# Patient Record
Sex: Female | Born: 1940 | Race: White | Hispanic: No | Smoking: Never smoker
Health system: Southern US, Community
[De-identification: ages and names within clinical notes are randomized; demographics above are authoritative.]

## PROBLEM LIST (undated history)

## (undated) DIAGNOSIS — F0390 Unspecified dementia without behavioral disturbance: Secondary | ICD-10-CM

## (undated) DIAGNOSIS — E079 Disorder of thyroid, unspecified: Secondary | ICD-10-CM

## (undated) HISTORY — PX: ABDOMINAL HYSTERECTOMY: SHX81

---

## 2018-11-25 ENCOUNTER — Encounter (HOSPITAL_BASED_OUTPATIENT_CLINIC_OR_DEPARTMENT_OTHER): Payer: Self-pay | Admitting: Emergency Medicine

## 2018-11-25 ENCOUNTER — Emergency Department (HOSPITAL_BASED_OUTPATIENT_CLINIC_OR_DEPARTMENT_OTHER): Payer: Medicare HMO

## 2018-11-25 ENCOUNTER — Other Ambulatory Visit: Payer: Self-pay

## 2018-11-25 ENCOUNTER — Observation Stay (HOSPITAL_BASED_OUTPATIENT_CLINIC_OR_DEPARTMENT_OTHER)
Admission: EM | Admit: 2018-11-25 | Discharge: 2018-11-27 | Disposition: A | Discharge status: Home / Self Care | Payer: Medicare HMO | Attending: Internal Medicine | Admitting: Internal Medicine

## 2018-11-25 DIAGNOSIS — R2681 Unsteadiness on feet: Secondary | ICD-10-CM | POA: Insufficient documentation

## 2018-11-25 DIAGNOSIS — E039 Hypothyroidism, unspecified: Secondary | ICD-10-CM | POA: Diagnosis not present

## 2018-11-25 DIAGNOSIS — I2699 Other pulmonary embolism without acute cor pulmonale: Secondary | ICD-10-CM | POA: Diagnosis not present

## 2018-11-25 DIAGNOSIS — Z888 Allergy status to other drugs, medicaments and biological substances status: Secondary | ICD-10-CM | POA: Insufficient documentation

## 2018-11-25 DIAGNOSIS — I82412 Acute embolism and thrombosis of left femoral vein: Secondary | ICD-10-CM | POA: Diagnosis not present

## 2018-11-25 DIAGNOSIS — R531 Weakness: Secondary | ICD-10-CM | POA: Insufficient documentation

## 2018-11-25 DIAGNOSIS — R630 Anorexia: Secondary | ICD-10-CM | POA: Insufficient documentation

## 2018-11-25 DIAGNOSIS — Z6822 Body mass index (BMI) 22.0-22.9, adult: Secondary | ICD-10-CM | POA: Diagnosis not present

## 2018-11-25 DIAGNOSIS — E876 Hypokalemia: Secondary | ICD-10-CM | POA: Insufficient documentation

## 2018-11-25 DIAGNOSIS — Z79899 Other long term (current) drug therapy: Secondary | ICD-10-CM | POA: Insufficient documentation

## 2018-11-25 DIAGNOSIS — G309 Alzheimer's disease, unspecified: Secondary | ICD-10-CM | POA: Diagnosis not present

## 2018-11-25 DIAGNOSIS — R159 Full incontinence of feces: Secondary | ICD-10-CM | POA: Insufficient documentation

## 2018-11-25 DIAGNOSIS — F0390 Unspecified dementia without behavioral disturbance: Secondary | ICD-10-CM | POA: Diagnosis present

## 2018-11-25 DIAGNOSIS — Z79890 Hormone replacement therapy (postmenopausal): Secondary | ICD-10-CM | POA: Insufficient documentation

## 2018-11-25 DIAGNOSIS — Z90710 Acquired absence of both cervix and uterus: Secondary | ICD-10-CM | POA: Diagnosis not present

## 2018-11-25 DIAGNOSIS — K5730 Diverticulosis of large intestine without perforation or abscess without bleeding: Secondary | ICD-10-CM | POA: Insufficient documentation

## 2018-11-25 HISTORY — DX: Disorder of thyroid, unspecified: E07.9

## 2018-11-25 HISTORY — DX: Unspecified dementia, unspecified severity, without behavioral disturbance, psychotic disturbance, mood disturbance, and anxiety: F03.90

## 2018-11-25 LAB — URINALYSIS, MICROSCOPIC (REFLEX)

## 2018-11-25 LAB — CBC WITH DIFFERENTIAL/PLATELET
Abs Immature Granulocytes: 0.05 10*3/uL (ref 0.00–0.07)
Basophils Absolute: 0.1 10*3/uL (ref 0.0–0.1)
Basophils Relative: 1 %
EOS PCT: 1 %
Eosinophils Absolute: 0.1 10*3/uL (ref 0.0–0.5)
HCT: 43.5 % (ref 36.0–46.0)
Hemoglobin: 13.4 g/dL (ref 12.0–15.0)
Immature Granulocytes: 1 %
LYMPHS PCT: 16 %
Lymphs Abs: 1.6 10*3/uL (ref 0.7–4.0)
MCH: 28.7 pg (ref 26.0–34.0)
MCHC: 30.8 g/dL (ref 30.0–36.0)
MCV: 93.1 fL (ref 80.0–100.0)
Monocytes Absolute: 0.8 10*3/uL (ref 0.1–1.0)
Monocytes Relative: 8 %
Neutro Abs: 7.6 10*3/uL (ref 1.7–7.7)
Neutrophils Relative %: 73 %
Platelets: 412 10*3/uL — ABNORMAL HIGH (ref 150–400)
RBC: 4.67 MIL/uL (ref 3.87–5.11)
RDW: 13.3 % (ref 11.5–15.5)
WBC: 10.2 10*3/uL (ref 4.0–10.5)
nRBC: 0 % (ref 0.0–0.2)

## 2018-11-25 LAB — COMPREHENSIVE METABOLIC PANEL
ALT: 27 U/L (ref 0–44)
AST: 46 U/L — ABNORMAL HIGH (ref 15–41)
Albumin: 3.1 g/dL — ABNORMAL LOW (ref 3.5–5.0)
Alkaline Phosphatase: 73 U/L (ref 38–126)
Anion gap: 10 (ref 5–15)
BUN: 13 mg/dL (ref 8–23)
CO2: 27 mmol/L (ref 22–32)
Calcium: 8.7 mg/dL — ABNORMAL LOW (ref 8.9–10.3)
Chloride: 108 mmol/L (ref 98–111)
Creatinine, Ser: 1.19 mg/dL — ABNORMAL HIGH (ref 0.44–1.00)
GFR calc Af Amer: 52 mL/min — ABNORMAL LOW (ref >60)
GFR calc non Af Amer: 45 mL/min — ABNORMAL LOW (ref >60)
Glucose, Bld: 116 mg/dL — ABNORMAL HIGH (ref 70–99)
Potassium: 3.4 mmol/L — ABNORMAL LOW (ref 3.5–5.1)
SODIUM: 145 mmol/L (ref 135–145)
Total Bilirubin: 0.6 mg/dL (ref 0.3–1.2)
Total Protein: 7.7 g/dL (ref 6.5–8.1)

## 2018-11-25 LAB — URINALYSIS, ROUTINE W REFLEX MICROSCOPIC
Glucose, UA: NEGATIVE mg/dL
Hgb urine dipstick: NEGATIVE
Ketones, ur: 15 mg/dL — AB
Nitrite: NEGATIVE
PROTEIN: NEGATIVE mg/dL
Specific Gravity, Urine: 1.025 (ref 1.005–1.030)
pH: 5.5 (ref 5.0–8.0)

## 2018-11-25 LAB — TROPONIN I: Troponin I: <0.03 ng/mL (ref <0.03)

## 2018-11-25 LAB — MAGNESIUM: Magnesium: 2.2 mg/dL (ref 1.7–2.4)

## 2018-11-25 MED ORDER — HEPARIN BOLUS VIA INFUSION
2700.0000 [IU] | Freq: Once | INTRAVENOUS | Status: AC
  Administered 2018-11-25: 2700 [IU] via INTRAVENOUS

## 2018-11-25 MED ORDER — SODIUM CHLORIDE 0.9 % IV BOLUS
500.0000 mL | Freq: Once | INTRAVENOUS | Status: AC
  Administered 2018-11-25: 500 mL via INTRAVENOUS

## 2018-11-25 MED ORDER — IOPAMIDOL (ISOVUE-370) INJECTION 76%
100.0000 mL | Freq: Once | INTRAVENOUS | Status: AC | PRN
  Administered 2018-11-25: 51 mL via INTRAVENOUS

## 2018-11-25 MED ORDER — POTASSIUM CHLORIDE CRYS ER 20 MEQ PO TBCR
40.0000 meq | EXTENDED_RELEASE_TABLET | Freq: Once | ORAL | Status: AC
  Administered 2018-11-25: 40 meq via ORAL
  Filled 2018-11-25: qty 2

## 2018-11-25 MED ORDER — MAGNESIUM SULFATE 2 GM/50ML IV SOLN
2.0000 g | Freq: Once | INTRAVENOUS | Status: AC
  Administered 2018-11-25: 2 g via INTRAVENOUS
  Filled 2018-11-25: qty 50

## 2018-11-25 MED ORDER — HEPARIN (PORCINE) 25000 UT/250ML-% IV SOLN
900.0000 [IU]/h | INTRAVENOUS | Status: AC
  Administered 2018-11-25: 900 [IU]/h via INTRAVENOUS
  Filled 2018-11-25: qty 250

## 2018-11-25 NOTE — ED Provider Notes (Signed)
[facility] EMERGENCY DEPARTMENT Provider Note   CSN: [id number] Arrival date & time: 11/25/18  1622    History   Chief Complaint Chief Complaint  Patient presents with  . Back Pain  . Anorexia    HPI Stephanie Hart is a 78 y.o. female.     The history is provided by a relative and medical records. No language interpreter was used.  Back Pain   Stephanie Hart is a 78 y.o. female who presents to the Emergency Department complaining of weakness. The five caveat due to dementia. History is provided by the patient's daughter. She presents from home for evaluation of one and 1/2 to 2 weeks of progressive weakness. Family reports poor oral intake with increased fatigue and increase sleeping. The patient has complained of occasional back pain. She does perform her own toileting and has no urinary or bowel complaints but family notes that she has had occasional stool incontinence over the last week and family noted blood in her underpants today. No reports of fevers, vomiting, chest pain. She did have dyspnea on exertion when coming down the stairs this morning. No coughing, pain complaints. She does have a history of dementia and has confusion at times but has been more agitated in the last week and 1/2 to 2 weeks. No known sick contacts. She does have a history of hysterectomy. She has a history of thyroid disease. No additional medical problems. Past Medical History:  Diagnosis Date  . Dementia (HCC)   . Thyroid disease     Patient Active Problem List   Diagnosis Date Noted  . Pulmonary emboli (HCC) 11/25/2018    Past Surgical History:  Procedure Laterality Date  . ABDOMINAL HYSTERECTOMY       OB History   No obstetric history on file.      Home Medications    Prior to Admission medications   Medication Sig Start Date End Date Taking? Authorizing Provider  Flaxseed, Linseed, (FLAX SEED OIL) 1000 MG CAPS Take 1,000 mg by mouth daily.    Yes [provider]    levothyroxine (SYNTHROID, LEVOTHROID) 50 MCG tablet Take 50 mcg by mouth daily before breakfast.  06/06/18  Yes [provider]  memantine (NAMENDA) 10 MG tablet Take 10 mg by mouth 2 (two) times daily.  08/21/18  Yes [provider]  QUEtiapine (SEROQUEL) 25 MG tablet Take 25 mg by mouth 3 (three) times daily as needed (agitation).  11/19/18  Yes [provider]  vitamin B-12 (CYANOCOBALAMIN) 1000 MCG tablet Take 1,000 mcg by mouth daily.  06/05/18  Yes [provider]  vitamin E 100 UNIT capsule Take 400 Units by mouth daily.   Yes [provider]    Family History History reviewed. No pertinent family history.  Social History Social History   Tobacco Use  . Smoking status: Never Smoker  . Smokeless tobacco: Never Used  Substance Use Topics  . Alcohol use: Never    Frequency: Never  . Drug use: Never     Allergies   Donepezil; Corn oil; and Lactobacillus  [acidophilus]   Review of Systems Review of Systems  Musculoskeletal: Positive for back pain.  All other systems reviewed and are negative.    Physical Exam Updated Vital Signs BP (!) 148/81 (BP Location: Right Arm)   Pulse 75   Temp 97.9 F (36.6 C) (Oral)   Resp 18   Ht 5' 1.5" (1.562 m)   Wt 54.9 kg   SpO2 97%  BMI 22.52 kg/m   Physical Exam Vitals signs and nursing note reviewed.  Constitutional:      Appearance: She is well-developed.  HENT:     Head: Normocephalic and atraumatic.  Cardiovascular:     Rate and Rhythm: Normal rate and regular rhythm.     Heart sounds: No murmur.  Pulmonary:     Effort: Pulmonary effort is normal. No respiratory distress.     Breath sounds: Normal breath sounds.  Abdominal:     Palpations: Abdomen is soft.     Tenderness: There is no abdominal tenderness. There is no guarding or rebound.  Musculoskeletal:        General: No swelling or tenderness.  Skin:    General: Skin is warm and dry.  Neurological:     Mental  Status: She is alert.     Comments: Disoriented to place and time.  5/5 strength in all four extremities.    Psychiatric:        Behavior: Behavior normal.      ED Treatments / Results  Labs (all labs ordered are listed, but only abnormal results are displayed) Labs Reviewed  URINALYSIS, ROUTINE W REFLEX MICROSCOPIC - Abnormal; Notable for the following components:      Result Value   APPearance HAZY (*)    Bilirubin Urine MODERATE (*)    Ketones, ur 15 (*)    Leukocytes,Ua TRACE (*)    All other components within normal limits  COMPREHENSIVE METABOLIC PANEL - Abnormal; Notable for the following components:   Potassium 3.4 (*)    Glucose, Bld 116 (*)    Creatinine, Ser 1.19 (*)    Calcium 8.7 (*)    Albumin 3.1 (*)    AST 46 (*)    GFR calc non Af Amer 45 (*)    GFR calc Af Amer 52 (*)    All other components within normal limits  CBC WITH DIFFERENTIAL/PLATELET - Abnormal; Notable for the following components:   Platelets 412 (*)    All other components within normal limits  URINALYSIS, MICROSCOPIC (REFLEX) - Abnormal; Notable for the following components:   Bacteria, UA RARE (*)    All other components within normal limits  URINE CULTURE  TROPONIN I  MAGNESIUM  HEPARIN LEVEL (UNFRACTIONATED)  CBC    EKG EKG Interpretation  Date/Time:  Sunday November 25 2018 17:47:10 EDT Ventricular Rate:  86 PR Interval:    QRS Duration: 91 QT Interval:  414 QTC Calculation: 496 R Axis:   13 Text Interpretation:  Sinus rhythm with sinus arrhythmia Low voltage, precordial leads Borderline prolonged QT interval Confirmed by Tilden Fossa [phone]) on 11/25/2018 5:49:46 PM   Radiology Dg Chest 2 View  Result Date: 11/25/2018 CLINICAL DATA:  Weakness, dyspnea on exertion EXAM: CHEST - 2 VIEW COMPARISON:  None. FINDINGS: Normal heart size. Normal mediastinal contour. No pneumothorax. No pleural effusion. No pulmonary edema. Mild platelike bibasilar scarring versus atelectasis. No  acute consolidative airspace disease. IMPRESSION: Mild platelike bibasilar scarring versus atelectasis. Otherwise no active disease in the chest. Electronically Signed   By: Delbert Phenix M.D.   On: 11/25/2018 18:38   Ct Angio Chest Pe W/cm &/or Wo Cm  Result Date: 11/25/2018 CLINICAL DATA:  Chest pain. EXAM: CT ANGIOGRAPHY CHEST WITH CONTRAST TECHNIQUE: Multidetector CT imaging of the chest was performed using the standard protocol during bolus administration of intravenous contrast. Multiplanar CT image reconstructions and MIPs were obtained to evaluate the vascular anatomy. CONTRAST:  72mL ISOVUE-370 IOPAMIDOL (ISOVUE-370)  INJECTION 76% COMPARISON:  Chest x-ray November 25, 2018 FINDINGS: Cardiovascular: No aneurysm or dissection seen in the thoracic aorta with mild atherosclerotic change. The heart size is normal. Mild coronary artery calcifications in the left coronary arteries. Pulmonary emboli are identified in multiple branches of the right upper lobe pulmonary arteries and in multiple right lower lobe branches. Lobar branches are involved on the right. The clot burden extends into the periphery of the right main pulmonary artery. A small subsegmental embolus is seen on the left on series 6, image 104. Mediastinum/Nodes: There is a small right pleural effusion and a small pericardial effusion. Thyroid and esophagus are unremarkable. No adenopathy. The chest wall is unremarkable. Lungs/Pleura: Central airways are normal. No pneumothorax. Small right-sided pleural effusion. Opacities in the dependent portions of the lung bases are seen, largely explained by atelectasis. Some of the opacities are a little more rounded and developing infarcts given known pulmonary emboli are not excluded. Opacity in the right middle lobe is thought to be atelectasis. No other acute abnormalities within the lungs. Upper Abdomen: No acute abnormality. Musculoskeletal: No chest wall abnormality. No acute or significant osseous  findings. Review of the MIP images confirms the above findings. IMPRESSION: 1. Pulmonary emboli are identified, right greater than left. Lobar emboli are seen in the right upper and lower lobes with a small subsegmental at thrombus on the left. The most central embolus on the right reaches the periphery of the right main pulmonary artery. The right ventricle measures 4.1 cm in greatest dimension and the left measures 5.0 cm. The RV/LV ratio is 0.82 with no convincing evidence of heart strain. 2. Bibasilar opacities are largely explained by atelectasis. However, mildly rounded opacities in the right base could represent early developing infarcts. Recommend attention on follow-up. 3. Mild atherosclerotic change in the nonaneurysmal aorta. Mild coronary artery calcifications. 4. Small pleural effusion. Findings called to Dr. Madilyn Hook. Aortic Atherosclerosis (ICD10-I70.0). Electronically Signed   By: Gerome Sam III M.D   On: 11/25/2018 20:02   Ct Renal Stone Study  Result Date: 11/25/2018 CLINICAL DATA:  Blood found in underwear, uncertain source, weakness, decreased appetite, lethargy EXAM: CT ABDOMEN AND PELVIS WITHOUT CONTRAST TECHNIQUE: Multidetector CT imaging of the abdomen and pelvis was performed following the standard protocol without IV contrast. COMPARISON:  None. FINDINGS: Lower chest: Small right pleural effusion with multiple opacities, including 2 subpleural opacities of the right lung base, 1 demonstrating central clearing (series 2, image 11). Hepatobiliary: No focal liver abnormality is seen. No gallstones, gallbladder wall thickening, or biliary dilatation. Pancreas: Unremarkable. No pancreatic ductal dilatation or surrounding inflammatory changes. Spleen: Normal in size without focal abnormality. Adrenals/Urinary Tract: Adrenal glands are unremarkable. Large simple appearing cyst of the right kidney. Kidneys are otherwise normal, without renal calculi, focal lesion, or hydronephrosis. Bladder is  unremarkable. Stomach/Bowel: Stomach is within normal limits. Incidental duodenal diverticulum. Appendix not clearly visualized and may be surgically absent. No evidence of bowel wall thickening, distention, or inflammatory changes. Sigmoid diverticulosis. Vascular/Lymphatic: No significant vascular findings are present. No enlarged abdominal or pelvic lymph nodes. Reproductive: No mass or other abnormality. Status post hysterectomy. Other: No abdominal wall hernia or abnormality. No abdominopelvic ascites. Musculoskeletal: No acute or significant osseous findings. IMPRESSION: 1. No non-contrast CT findings of the abdomen or pelvis to explain bleeding. Sigmoid diverticulosis without evidence of acute diverticulitis. Diverticulosis is a common source of GI bleeding. 2. Small right pleural effusion with multiple opacities including two subpleural opacities in the right lung base, one  demonstrating central clearing. These are of uncertain nature. Differential considerations are broad and include infection, other inflammatory etiology such as organizing pneumonia, or pulmonary embolism with developing pulmonary infarcts. Correlate with clinical presentation and consider dedicated CT imaging of the chest to further evaluate. Electronically Signed   By: Lauralyn Primes M.D.   On: 11/25/2018 18:33    Procedures Procedures (including critical care time) CRITICAL CARE Performed by: Tilden Fossa   Total critical care time: 35 minutes  Critical care time was exclusive of separately billable procedures and treating other patients.  Critical care was necessary to treat or prevent imminent or life-threatening deterioration.  Critical care was time spent personally by me on the following activities: development of treatment plan with patient and/or surrogate as well as nursing, discussions with consultants, evaluation of patient's response to treatment, examination of patient, obtaining history from patient or  surrogate, ordering and performing treatments and interventions, ordering and review of laboratory studies, ordering and review of radiographic studies, pulse oximetry and re-evaluation of patient's condition.  Medications Ordered in ED Medications  heparin ADULT infusion 100 units/mL (25000 units/239mL sodium chloride 0.45%) (900 Units/hr Intravenous New Bag/Given 11/25/18 2040)  sodium chloride 0.9 % bolus 500 mL (0 mLs Intravenous Stopped 11/25/18 1855)  iopamidol (ISOVUE-370) 76 % injection 100 mL (51 mLs Intravenous Contrast Given 11/25/18 1911)  heparin bolus via infusion 2,700 Units (2,700 Units Intravenous Bolus from Bag 11/25/18 2037)  potassium chloride SA (K-DUR,KLOR-CON) CR tablet 40 mEq (40 mEq Oral Given 11/25/18 2059)  magnesium sulfate IVPB 2 g 50 mL (2 g Intravenous New Bag/Given 11/25/18 2111)     Initial Impression / Assessment and Plan / ED Course  I have reviewed the triage vital signs and the nursing notes.  Pertinent labs & imaging results that were available during my care of the patient were reviewed by me and considered in my medical decision making (see chart for details).        Patient presents to the emergency department for evaluation of increased lethargy, back pain, hematuria. Patient is mildly confused on evaluation but in no acute distress. She denies any current complaints. CT scan study with changes concerning for possible PE, UA not consistent with UTI. CTA was obtained, which demonstrates a large pulmonary embolism. She was started on heparin. Discussed with patient and family findings of studies and need for anticoagulation and admission for further evaluation and treatment. Family and patient agreement with treatment plan. Hospitalist consulted for admission for further treatment.  Final Clinical Impressions(s) / ED Diagnoses   Final diagnoses:  Other acute pulmonary embolism without acute cor pulmonale [facility])    ED Discharge Orders    None       Tilden Fossa, MD 11/25/18 2357

## 2018-11-25 NOTE — ED Notes (Signed)
Attempted to call report to RN at [facility]; advised that she was in a contact room at present time and would need to call me back for report on patient.

## 2018-11-25 NOTE — ED Notes (Signed)
[facility] notified (Tammy) - patient ready for transport 

## 2018-11-25 NOTE — ED Notes (Signed)
Report given to: Ardyth Harps, RN at [facility].

## 2018-11-25 NOTE — ED Notes (Signed)
ED Provider at bedside. 

## 2018-11-25 NOTE — ED Notes (Signed)
Patient taking the following personal home medications: Mementine 10mg  and Quetiapine 25mg ; Dr. Madilyn Hook aware and approved administration.

## 2018-11-25 NOTE — ED Notes (Signed)
Per patient's daughter, patient has been refusing to eat or drink over the past week.  She does have dementia; blood noted in underwear this morning; unsure of source.

## 2018-11-25 NOTE — ED Notes (Signed)
Pt weighs 121.2lbs

## 2018-11-25 NOTE — Progress Notes (Signed)
ANTICOAGULATION CONSULT NOTE - Initial Consult  Pharmacy Consult for heparin Indication: pulmonary embolus  Allergies  Allergen Reactions  . Donepezil Other (See Comments)    Behavioral problems    Patient Measurements: Height: 5' 1.5" (156.2 cm) Weight: 121 lb 2 oz (54.9 kg) IBW/kg (Calculated) : 48.95 Heparin Dosing Weight: 54.9 kg  Vital Signs: Temp: 97.8 F (36.6 C) (03/08 1632) Temp Source: Oral (03/08 1632) BP: 178/87 (03/08 2015) Pulse Rate: 89 (03/08 2015)  Labs: Recent Labs    11/25/18 1723  HGB 13.4  HCT 43.5  PLT 412*  CREATININE 1.19*    Estimated Creatinine Clearance: 31.6 mL/min (A) (by C-G formula based on SCr of 1.19 mg/dL (H)).   Medical History: Past Medical History:  Diagnosis Date  . Dementia (HCC)   . Thyroid disease     Assessment: 41 yof presenting with weakness and dyspnea on exertion. No AC PTA. CTA showing PE w/o convincing evidence of R heart strain.  Hgb 13.4, plt 412. No s/sx of bleeding.   Goal of Therapy:  Heparin level 0.3-0.7 units/ml Monitor platelets by anticoagulation protocol: Yes   Plan:  Give 2700 units bolus x 1 Start heparin infusion at 900 units/hr Check anti-Xa level in 8 hours and daily while on heparin Continue to monitor H&H and platelets  Sherron Monday, PharmD, BCCCP Clinical Pharmacist  Pager: [phone] Phone: [phone] 11/25/2018,8:22 PM

## 2018-11-25 NOTE — ED Triage Notes (Signed)
Patient states that she is unsure why she is here. She has a hx of dementia. Per the family the patient is not eating, she is SOB and more confused. The patient is having possible urinary vs rectal bleeding.

## 2018-11-25 NOTE — ED Notes (Signed)
Patient transported to CT 

## 2018-11-25 NOTE — H&P (Signed)
History and Physical    Stephanie Hart ZOX:[id number] DOB: 1941-03-15 DOA: 11/25/2018  PCP: [name], PA   Patient coming from: [facility].  I have personally briefly reviewed patient's old medical records in [facility]  Chief Complaint: Generalized weakness.  HPI: Stephanie Hart is a 78 y.o. female with medical history significant of dementia, hypothyroidism who is transferred from [facility] after presenting there with a week history of progressively worse weakness, decreased appetite, increased sleepiness for 1 to 2 weeks.  Her granddaughter explained that she is usually able to do her own toileting, but has been unable to do so in recent days, when she has had some fecal incontinence.  They also noticed that she has some bloody staining in her underwear.  However, the patient has had a hysterectomy.  No further history is available from the patient given dementia status.  ED Course: Initial vital signs temperature 97.8 F, pulse 92, respirations 20, blood pressure 131/113 mmHg and O2 sat 96% on room air.  The patient was started on heparin infusion.  Work-up shows a urinalysis with hazy appearance, moderate bilirubin, 50 mg/dL of ketones and trace leukocyte esterase.  CBC shows a white count of 10.2, hemoglobin 13.4 g/dL and platelets 811.CMP shows a potassium of 3.4 mmol/L, glucose of 116 and creatinine 1.19.  Total protein was 3.1 g/dL and AST was 46 units/L.  All other CMP values are within normal limits.  Magnesium and first set of troponin were normal.  CTA of the chest show bilateral pulmonary emboli.  Please see images and full radiology report for further detail.  Review of Systems: Unable to obtain.   Past Medical History:  Diagnosis Date  . Dementia (HCC)   . Thyroid disease     Past Surgical History:  Procedure Laterality Date  . ABDOMINAL HYSTERECTOMY       reports that she has never smoked. She has never used smokeless tobacco. She reports that she does not  drink alcohol or use drugs.  Allergies  Allergen Reactions  . Donepezil Other (See Comments)    Behavioral problems  . Corn Oil Other (See Comments)    Gas, Diarrhea  . Lactobacillus  [Acidophilus] Other (See Comments)    Gas and Diarrhea   Family medical history Mother-type 2 diabetes Father-alcohol abuse.  Prior to Admission medications   Medication Sig Start Date End Date Taking? Authorizing Provider  Flaxseed, Linseed, (FLAX SEED OIL) 1000 MG CAPS Take 1,000 mg by mouth daily.    Yes [provider]  levothyroxine (SYNTHROID, LEVOTHROID) 50 MCG tablet Take 50 mcg by mouth daily before breakfast.  06/06/18  Yes [provider]  memantine (NAMENDA) 10 MG tablet Take 10 mg by mouth 2 (two) times daily.  08/21/18  Yes [provider]  QUEtiapine (SEROQUEL) 25 MG tablet Take 25 mg by mouth 3 (three) times daily as needed (agitation).  11/19/18  Yes [provider]  vitamin B-12 (CYANOCOBALAMIN) 1000 MCG tablet Take 1,000 mcg by mouth daily.  06/05/18  Yes [provider]  vitamin E 100 UNIT capsule Take 400 Units by mouth daily.   Yes [provider]    Physical Exam: Vitals:   11/25/18 2030 11/25/18 2100 11/25/18 2150 11/25/18 2303  BP: (!) 167/120 (!) 168/87 (!) 150/83 (!) 148/81  Pulse: 84 79 78 75  Resp:  20  18  Temp:    97.9 F (36.6 C)  TempSrc:    Oral  SpO2: 97% 96% 95%  97%  Weight:      Height:        Constitutional: Frail, elderly female, but in NAD, calm, comfortable Eyes: PERRL, lids and conjunctivae normal ENMT: Mucous membranes are moist. Posterior pharynx clear of any exudate or lesions. Neck: normal, supple, no masses, no thyromegaly Respiratory: clear to auscultation bilaterally, no wheezing, no crackles. Normal respiratory effort. No accessory muscle use.  Cardiovascular: Regular rate and rhythm, no murmurs / rubs / gallops. No extremity edema. 2+ pedal pulses. No carotid bruits.  Abdomen: Soft, no  tenderness, no masses palpated. No hepatosplenomegaly. Bowel sounds positive.  Musculoskeletal: no clubbing / cyanosis.  Good ROM, no contractures. Normal muscle tone.  Skin: Hyperpigmented macules on upper back. Neurologic: CN 2-12 grossly intact. Sensation intact, DTR normal. Strength 5/5 in all 4.  Psychiatric: Alert and oriented x 1, but disoriented to place, situation.  Labs on Admission: I have personally reviewed following labs and imaging studies  CBC: Recent Labs  Lab 11/25/18 1723  WBC 10.2  NEUTROABS 7.6  HGB 13.4  HCT 43.5  MCV 93.1  PLT 412*   Basic Metabolic Panel: Recent Labs  Lab 11/25/18 1723 11/25/18 2045  NA 145  --   K 3.4*  --   CL 108  --   CO2 27  --   GLUCOSE 116*  --   BUN 13  --   CREATININE 1.19*  --   CALCIUM 8.7*  --   MG  --  2.2   GFR: Estimated Creatinine Clearance: 31.6 mL/min (A) (by C-G formula based on SCr of 1.19 mg/dL (H)). Liver Function Tests: Recent Labs  Lab 11/25/18 1723  AST 46*  ALT 27  ALKPHOS 73  BILITOT 0.6  PROT 7.7  ALBUMIN 3.1*   No results for input(s): LIPASE, AMYLASE in the last 168 hours. No results for input(s): AMMONIA in the last 168 hours. Coagulation Profile: No results for input(s): INR, PROTIME in the last 168 hours. Cardiac Enzymes: Recent Labs  Lab 11/25/18 2030  TROPONINI <0.03   BNP (last 3 results) No results for input(s): PROBNP in the last 8760 hours. HbA1C: No results for input(s): HGBA1C in the last 72 hours. CBG: No results for input(s): GLUCAP in the last 168 hours. Lipid Profile: No results for input(s): CHOL, HDL, LDLCALC, TRIG, CHOLHDL, LDLDIRECT in the last 72 hours. Thyroid Function Tests: No results for input(s): TSH, T4TOTAL, FREET4, T3FREE, THYROIDAB in the last 72 hours. Anemia Panel: No results for input(s): VITAMINB12, FOLATE, FERRITIN, TIBC, IRON, RETICCTPCT in the last 72 hours. Urine analysis:    Component Value Date/Time   COLORURINE YELLOW 11/25/2018 1723    APPEARANCEUR HAZY (A) 11/25/2018 1723   LABSPEC 1.025 11/25/2018 1723   PHURINE 5.5 11/25/2018 1723   GLUCOSEU NEGATIVE 11/25/2018 1723   HGBUR NEGATIVE 11/25/2018 1723   BILIRUBINUR MODERATE (A) 11/25/2018 1723   KETONESUR 15 (A) 11/25/2018 1723   PROTEINUR NEGATIVE 11/25/2018 1723   NITRITE NEGATIVE 11/25/2018 1723   LEUKOCYTESUR TRACE (A) 11/25/2018 1723    Radiological Exams on Admission: Dg Chest 2 View  Result Date: 11/25/2018 CLINICAL DATA:  Weakness, dyspnea on exertion EXAM: CHEST - 2 VIEW COMPARISON:  None. FINDINGS: Normal heart size. Normal mediastinal contour. No pneumothorax. No pleural effusion. No pulmonary edema. Mild platelike bibasilar scarring versus atelectasis. No acute consolidative airspace disease. IMPRESSION: Mild platelike bibasilar scarring versus atelectasis. Otherwise no active disease in the chest. Electronically Signed   By: Delbert Phenix M.D.   On: 11/25/2018 18:38  Ct Angio Chest Pe W/cm &/or Wo Cm  Result Date: 11/25/2018 CLINICAL DATA:  Chest pain. EXAM: CT ANGIOGRAPHY CHEST WITH CONTRAST TECHNIQUE: Multidetector CT imaging of the chest was performed using the standard protocol during bolus administration of intravenous contrast. Multiplanar CT image reconstructions and MIPs were obtained to evaluate the vascular anatomy. CONTRAST:  37mL ISOVUE-370 IOPAMIDOL (ISOVUE-370) INJECTION 76% COMPARISON:  Chest x-ray November 25, 2018 FINDINGS: Cardiovascular: No aneurysm or dissection seen in the thoracic aorta with mild atherosclerotic change. The heart size is normal. Mild coronary artery calcifications in the left coronary arteries. Pulmonary emboli are identified in multiple branches of the right upper lobe pulmonary arteries and in multiple right lower lobe branches. Lobar branches are involved on the right. The clot burden extends into the periphery of the right main pulmonary artery. A small subsegmental embolus is seen on the left on series 6, image 104.  Mediastinum/Nodes: There is a small right pleural effusion and a small pericardial effusion. Thyroid and esophagus are unremarkable. No adenopathy. The chest wall is unremarkable. Lungs/Pleura: Central airways are normal. No pneumothorax. Small right-sided pleural effusion. Opacities in the dependent portions of the lung bases are seen, largely explained by atelectasis. Some of the opacities are a little more rounded and developing infarcts given known pulmonary emboli are not excluded. Opacity in the right middle lobe is thought to be atelectasis. No other acute abnormalities within the lungs. Upper Abdomen: No acute abnormality. Musculoskeletal: No chest wall abnormality. No acute or significant osseous findings. Review of the MIP images confirms the above findings. IMPRESSION: 1. Pulmonary emboli are identified, right greater than left. Lobar emboli are seen in the right upper and lower lobes with a small subsegmental at thrombus on the left. The most central embolus on the right reaches the periphery of the right main pulmonary artery. The right ventricle measures 4.1 cm in greatest dimension and the left measures 5.0 cm. The RV/LV ratio is 0.82 with no convincing evidence of heart strain. 2. Bibasilar opacities are largely explained by atelectasis. However, mildly rounded opacities in the right base could represent early developing infarcts. Recommend attention on follow-up. 3. Mild atherosclerotic change in the nonaneurysmal aorta. Mild coronary artery calcifications. 4. Small pleural effusion. Findings called to Dr. Madilyn Hook. Aortic Atherosclerosis (ICD10-I70.0). Electronically Signed   By: Gerome Sam III M.D   On: 11/25/2018 20:02   Ct Renal Stone Study  Result Date: 11/25/2018 CLINICAL DATA:  Blood found in underwear, uncertain source, weakness, decreased appetite, lethargy EXAM: CT ABDOMEN AND PELVIS WITHOUT CONTRAST TECHNIQUE: Multidetector CT imaging of the abdomen and pelvis was performed following  the standard protocol without IV contrast. COMPARISON:  None. FINDINGS: Lower chest: Small right pleural effusion with multiple opacities, including 2 subpleural opacities of the right lung base, 1 demonstrating central clearing (series 2, image 11). Hepatobiliary: No focal liver abnormality is seen. No gallstones, gallbladder wall thickening, or biliary dilatation. Pancreas: Unremarkable. No pancreatic ductal dilatation or surrounding inflammatory changes. Spleen: Normal in size without focal abnormality. Adrenals/Urinary Tract: Adrenal glands are unremarkable. Large simple appearing cyst of the right kidney. Kidneys are otherwise normal, without renal calculi, focal lesion, or hydronephrosis. Bladder is unremarkable. Stomach/Bowel: Stomach is within normal limits. Incidental duodenal diverticulum. Appendix not clearly visualized and may be surgically absent. No evidence of bowel wall thickening, distention, or inflammatory changes. Sigmoid diverticulosis. Vascular/Lymphatic: No significant vascular findings are present. No enlarged abdominal or pelvic lymph nodes. Reproductive: No mass or other abnormality. Status post hysterectomy. Other: No abdominal  wall hernia or abnormality. No abdominopelvic ascites. Musculoskeletal: No acute or significant osseous findings. IMPRESSION: 1. No non-contrast CT findings of the abdomen or pelvis to explain bleeding. Sigmoid diverticulosis without evidence of acute diverticulitis. Diverticulosis is a common source of GI bleeding. 2. Small right pleural effusion with multiple opacities including two subpleural opacities in the right lung base, one demonstrating central clearing. These are of uncertain nature. Differential considerations are broad and include infection, other inflammatory etiology such as organizing pneumonia, or pulmonary embolism with developing pulmonary infarcts. Correlate with clinical presentation and consider dedicated CT imaging of the chest to further  evaluate. Electronically Signed   By: Lauralyn Primes M.D.   On: 11/25/2018 18:33    EKG: Independently reviewed. Vent. rate 86 BPM PR interval * ms QRS duration 91 ms QT/QTc 414/496 ms P-R-T axes 70 13 42  Assessment/Plan Principal Problem:   Pulmonary emboli (HCC) Continue supplemental oxygen. Continue heparin infusion. Switch to oral anticoagulant on discharge.  Active Problems:   Dementia (HCC) Continue on Namenda. Continue PRN Seroquel.    Hypothyroidism Continue levothyroxine 50 mcg p.o. daily.    Hypokalemia Replacing. Follow-up potassium level Magnesium has been supplemented   DVT prophylaxis: On heparin infusion. Code Status: Full code. Family Communication: Her granddaughters were present in her room. Disposition Plan: Admit for heparin infusion for bilateral pulmonary embolism Consults called:  Admission status: Telemetry/observation.   Bobette Mo MD [facility]  11/25/2018, 11:59 PM   This document was prepared using Dragon voice recognition software and may contain some unintended transcription errors.

## 2018-11-25 NOTE — ED Notes (Signed)
Family at bedside. 

## 2018-11-25 NOTE — ED Notes (Signed)
Report given to: Toribio Harbour, RN with [facility]; eta 20 minutes.

## 2018-11-26 ENCOUNTER — Observation Stay (HOSPITAL_BASED_OUTPATIENT_CLINIC_OR_DEPARTMENT_OTHER): Payer: Medicare HMO

## 2018-11-26 ENCOUNTER — Encounter (HOSPITAL_COMMUNITY): Payer: Self-pay | Admitting: Internal Medicine

## 2018-11-26 DIAGNOSIS — I2699 Other pulmonary embolism without acute cor pulmonale: Secondary | ICD-10-CM | POA: Diagnosis not present

## 2018-11-26 DIAGNOSIS — F0390 Unspecified dementia without behavioral disturbance: Secondary | ICD-10-CM | POA: Diagnosis not present

## 2018-11-26 DIAGNOSIS — I82412 Acute embolism and thrombosis of left femoral vein: Secondary | ICD-10-CM

## 2018-11-26 LAB — BASIC METABOLIC PANEL
Anion gap: 7 (ref 5–15)
BUN: 11 mg/dL (ref 8–23)
CO2: 27 mmol/L (ref 22–32)
Calcium: 8.5 mg/dL — ABNORMAL LOW (ref 8.9–10.3)
Chloride: 112 mmol/L — ABNORMAL HIGH (ref 98–111)
Creatinine, Ser: 1.07 mg/dL — ABNORMAL HIGH (ref 0.44–1.00)
GFR calc Af Amer: 59 mL/min — ABNORMAL LOW (ref >60)
GFR calc non Af Amer: 51 mL/min — ABNORMAL LOW (ref >60)
Glucose, Bld: 99 mg/dL (ref 70–99)
Potassium: 3.7 mmol/L (ref 3.5–5.1)
Sodium: 146 mmol/L — ABNORMAL HIGH (ref 135–145)

## 2018-11-26 LAB — CBC
HCT: 40.5 % (ref 36.0–46.0)
Hemoglobin: 12.1 g/dL (ref 12.0–15.0)
MCH: 28.6 pg (ref 26.0–34.0)
MCHC: 29.9 g/dL — ABNORMAL LOW (ref 30.0–36.0)
MCV: 95.7 fL (ref 80.0–100.0)
Platelets: 327 10*3/uL (ref 150–400)
RBC: 4.23 MIL/uL (ref 3.87–5.11)
RDW: 13.3 % (ref 11.5–15.5)
WBC: 6 10*3/uL (ref 4.0–10.5)
nRBC: 0 % (ref 0.0–0.2)

## 2018-11-26 LAB — ECHOCARDIOGRAM COMPLETE
Height: 61.5 in
Weight: 1938 oz

## 2018-11-26 LAB — TROPONIN I: Troponin I: <0.03 ng/mL (ref <0.03)

## 2018-11-26 LAB — HEPARIN LEVEL (UNFRACTIONATED): HEPARIN UNFRACTIONATED: 0.46 [IU]/mL (ref 0.30–0.70)

## 2018-11-26 MED ORDER — PANTOPRAZOLE SODIUM 40 MG PO TBEC
40.0000 mg | DELAYED_RELEASE_TABLET | Freq: Every day | ORAL | Status: DC
  Administered 2018-11-27: 40 mg via ORAL
  Filled 2018-11-26 (×2): qty 1

## 2018-11-26 MED ORDER — ACETAMINOPHEN 650 MG RE SUPP: 650.0000 mg | Freq: Four times a day (QID) | RECTAL | Status: DC | PRN

## 2018-11-26 MED ORDER — MEMANTINE HCL 10 MG PO TABS
10.0000 mg | ORAL_TABLET | Freq: Two times a day (BID) | ORAL | Status: DC
  Administered 2018-11-26 – 2018-11-27 (×3): 10 mg via ORAL
  Filled 2018-11-26 (×3): qty 1

## 2018-11-26 MED ORDER — ACETAMINOPHEN 325 MG PO TABS: 650.0000 mg | ORAL_TABLET | Freq: Four times a day (QID) | ORAL | Status: DC | PRN

## 2018-11-26 MED ORDER — QUETIAPINE FUMARATE 25 MG PO TABS: 25.0000 mg | ORAL_TABLET | Freq: Three times a day (TID) | ORAL | Status: DC | PRN

## 2018-11-26 MED ORDER — LEVOTHYROXINE SODIUM 50 MCG PO TABS
50.0000 ug | ORAL_TABLET | Freq: Every day | ORAL | Status: DC
  Administered 2018-11-26 – 2018-11-27 (×2): 50 ug via ORAL
  Filled 2018-11-26 (×2): qty 1

## 2018-11-26 MED ORDER — ADULT MULTIVITAMIN W/MINERALS CH
1.0000 | ORAL_TABLET | Freq: Every day | ORAL | Status: DC
  Administered 2018-11-26 – 2018-11-27 (×2): 1 via ORAL
  Filled 2018-11-26 (×2): qty 1

## 2018-11-26 MED ORDER — BOOST / RESOURCE BREEZE PO LIQD CUSTOM
1.0000 | Freq: Two times a day (BID) | ORAL | Status: DC
  Administered 2018-11-26 – 2018-11-27 (×2): 1 via ORAL

## 2018-11-26 MED ORDER — APIXABAN 5 MG PO TABS
10.0000 mg | ORAL_TABLET | Freq: Two times a day (BID) | ORAL | Status: DC
  Administered 2018-11-26 – 2018-11-27 (×3): 10 mg via ORAL
  Filled 2018-11-26 (×3): qty 2

## 2018-11-26 MED ORDER — ENSURE ENLIVE PO LIQD
237.0000 mL | ORAL | Status: DC
  Administered 2018-11-26 – 2018-11-27 (×2): 237 mL via ORAL

## 2018-11-26 MED ORDER — APIXABAN 5 MG PO TABS: 5.0000 mg | ORAL_TABLET | Freq: Two times a day (BID) | ORAL | Status: DC

## 2018-11-26 MED ORDER — PROCHLORPERAZINE EDISYLATE 10 MG/2ML IJ SOLN: 5.0000 mg | Freq: Four times a day (QID) | INTRAMUSCULAR | Status: DC | PRN

## 2018-11-26 MED ORDER — ENSURE ENLIVE PO LIQD: 237.0000 mL | Freq: Two times a day (BID) | ORAL | Status: DC

## 2018-11-26 NOTE — Care Management Note (Signed)
Case Management Note  Patient Details  Name: Stephanie Hart MRN: [id number] Date of Birth: 15-Jan-1941  Subjective/Objective: CM/CSW spoke to dtr Wille Celeste about d/c plans-Dtr had concerns about options-medicaid,not being able to provide a safe home since she works during the day-dtr has started the [employer] process & will continue to follow through.Dtr chose [facility] for HHC-HHRN/PT/OT/aide/CSW orderd, also a rw ordered-[facility] dme rep Vaughan Basta will deliver rw to rm prior d/c. Provided dtr w/palliative care service resourc. MD updated. No further CM needs.                 Action/Plan:dc home w/HHC/dme   Expected Discharge Date:                  Expected Discharge Plan:  Home w Home Health Services  In-House Referral:     Discharge planning Services  CM Consult  Post Acute Care Choice:    Choice offered to:  Adult Children  DME Arranged:  Walker rolling DME Agency:  [facility]  HH Arranged:  RN, PT, OT, Nurse's Aide, Social Work [employer] Agency:  [facility]  Status of Service:  Completed, signed off  If discussed at [employer], dates discussed:    Additional Comments:  Lanier Clam, RN 11/26/2018, 3:53 PM

## 2018-11-26 NOTE — Progress Notes (Signed)
Patient family requesting tele monitor be removed due to it causing pt agitation. MD paged, orders received. Stephanie Hart A

## 2018-11-26 NOTE — Progress Notes (Signed)
Bilateral lower extremity venous duplex completed. Preliminary results in Chart review CV Proc. [place] Jervis Trapani,RVS 11/26/2018 8:24 AM

## 2018-11-26 NOTE — Evaluation (Addendum)
Physical Therapy Evaluation Patient Details Name: Stephanie Hart MRN: [id number] DOB: 1941-02-02 Today's Date: 11/26/2018   History of Present Illness  78 year old with history of dementia, hypothyroidism was transferred to [facility] from [facility] for evaluation of generalized weakness and decreased appetite.  Pt found to have acute bilateral pulmonary embolism without cor pulmonale and acute L LE DVTs  Clinical Impression  Pt admitted with above diagnosis. Pt currently with functional limitations due to the deficits listed below (see PT Problem List).  Pt will benefit from skilled PT to increase their independence and safety with mobility to allow discharge to the venue listed below.  Pt assisted with ambulating in hallway and requiring occasional min assist for balance for higher level balance challenges.  Pt's family present and report pt has bedroom upstairs and concerned about pt falling.  No family can be present with pt during the day due to work.  Pt does present as moderate fall risk so recommend 24/7 initially upon d/c.  Due to pt being alone during the day and fall risk, recommend SNF.  If home, recommend increased home care as able.     Follow Up Recommendations Supervision/Assistance - 24 hour;SNF    Equipment Recommendations  Rolling walker with 5" wheels    Recommendations for Other Services       Precautions / Restrictions Precautions Precautions: Fall      Mobility  Bed Mobility Overal bed mobility: Needs Assistance Bed Mobility: Supine to Sit     Supine to sit: HOB elevated;Supervision        Transfers Overall transfer level: Needs assistance Equipment used: None Transfers: Sit to/from Stand Sit to Stand: Min guard         General transfer comment: min/guard for safety, slightly unsteady with rise however pt self corrected  Ambulation/Gait Ambulation/Gait assistance: Min assist Gait Distance (Feet): 200 Feet Assistive device: None Gait  Pattern/deviations: Step-through pattern;Decreased stride length;Narrow base of support     General Gait Details: pt with unsteady gait however self corrects, tends to drift to right side, unable to ambulate and turn head to read signs without stopping to read, slight assist at times for steadying  Stairs            Wheelchair Mobility    Modified Rankin (Stroke Patients Only)       Balance Overall balance assessment: Needs assistance         Standing balance support: No upper extremity supported Standing balance-Leahy Scale: Fair Standing balance comment: fair static, poor dynamic               High Level Balance Comments: higher level balance is difficult for pt and requires slight assist             Pertinent Vitals/Pain Pain Assessment: No/denies pain  SPO2 97% during ambulation on room air.    Home Living Family/patient expects to be discharged to:: Private residence Living Arrangements: Children Available Help at Discharge: Available PRN/intermittently Type of Home: House       Home Layout: Two level Home Equipment: None      Prior Function Level of Independence: Independent         Comments: family reports pt's mobility has been slowly declining over the past week     Hand Dominance        Extremity/Trunk Assessment        Lower Extremity Assessment Lower Extremity Assessment: Generalized weakness       Communication   Communication:  No difficulties  Cognition Arousal/Alertness: Awake/alert Behavior During Therapy: WFL for tasks assessed/performed Overall Cognitive Status: History of cognitive impairments - at baseline                                 General Comments: follows simple commands, unable to state her daughter and granddaughter in room while out walking in hallway      General Comments      Exercises     Assessment/Plan    PT Assessment Patient needs continued PT services  PT Problem  List Decreased strength;Decreased mobility;Decreased activity tolerance;Decreased balance;Decreased knowledge of use of DME;Decreased safety awareness;Decreased cognition;Decreased coordination       PT Treatment Interventions DME instruction;Functional mobility training;Gait training;Balance training;Patient/family education;Therapeutic activities;Therapeutic exercise;Stair training;Neuromuscular re-education    PT Goals (Current goals can be found in the Care Plan section)  Acute Rehab PT Goals PT Goal Formulation: With patient/family Time For Goal Achievement: 12/10/18 Potential to Achieve Goals: Good    Frequency Min 3X/week   Barriers to discharge        Co-evaluation               AM-PAC PT "6 Clicks" Mobility  Outcome Measure Help needed turning from your back to your side while in a flat bed without using bedrails?: None Help needed moving from lying on your back to sitting on the side of a flat bed without using bedrails?: A Little Help needed moving to and from a bed to a chair (including a wheelchair)?: A Little Help needed standing up from a chair using your arms (e.g., wheelchair or bedside chair)?: A Little Help needed to walk in hospital room?: A Little Help needed climbing 3-5 steps with a railing? : A Little 6 Click Score: 19    End of Session Equipment Utilized During Treatment: Gait belt Activity Tolerance: Patient tolerated treatment well Patient left: in chair;with call bell/phone within reach;with chair alarm set;with family/visitor present Nurse Communication: Mobility status PT Visit Diagnosis: Unsteadiness on feet (R26.81)    Time: 9449-6759 PT Time Calculation (min) (ACUTE ONLY): 21 min   Charges:   PT Evaluation $PT Eval Low Complexity: 1 Low     Zenovia Jarred, PT, DPT Acute Rehabilitation Services Office: [phone] Pager: [phone]  Sarajane Jews 11/26/2018, 2:47 PM  (Dr. Nelson Chimes paged and okay with PT evaluation today for  discharge planning.  Pt changing from IV heparin to oral anticoagulant today.)

## 2018-11-26 NOTE — Progress Notes (Signed)
PROGRESS NOTE    Stephanie Hart  WUJ:[id number] DOB: June 27, 1941 DOA: 11/25/2018 PCP: [name], PA   Brief Narrative:  78 year old with history of dementia, hypothyroidism was transferred here from med [facility] for evaluation of generalized weakness and decreased appetite.  Vital signs are stable.  Most of the labs are overall unremarkable except CTA of the chest showed bilateral pulmonary embolism.  Patient was started on heparin drip.   Assessment & Plan:   Principal Problem:   Pulmonary emboli (HCC) Active Problems:   Dementia (HCC)   Hypothyroidism   Hypokalemia  Acute bilateral pulmonary embolism without cor pulmonale Left-sided DVT; acute -No recent history of surgery, travel or family history. -Continue heparin drip, extensively educated on different types of anticoagulation including Coumadin, Eliquis and Xarelto.  Patient opts to go with NOAC.  She is typically capable of her own ADL S without any issues.  Does not use walker or cane to get around usually. - Case management and pharmacy to help with determining patient's cost and will prescribe Eliquis or Xarelto accordingly.  In the meantime continue heparin drip -Advised nursing staff to walk patient around as well to see how she does symptomatically -Echocardiogram-pending -Lower extremity Dopplers- left-sided DVT  Generalized weakness - Should get patient PT/OT to ensure her steadiness given she will be on anticoagulation now  History of dementia without behavioral disturbances -Resume home meds  Hypothyroidism -Continue Synthroid   DVT prophylaxis: Currently on heparin drip Code Status: Full code Family Communication: None at bedside Disposition Plan: Maintain patient on telemetry until work-up has been completed  Consultants:   None  Procedures:   None  Antimicrobials:   None   Subjective: Feels quite weak but overall tells me she is steady on her feet and able to answer all the  questions.  No complaints at the moment but she has not walked around much this morning.  Review of Systems Otherwise negative except as per HPI, including: General: Denies fever, chills, night sweats or unintended weight loss. Resp: Denies cough, wheezing, shortness of breath. Cardiac: Denies chest pain, palpitations, orthopnea, paroxysmal nocturnal dyspnea. GI: Denies abdominal pain, nausea, vomiting, diarrhea or constipation GU: Denies dysuria, frequency, hesitancy or incontinence MS: Denies muscle aches, joint pain or swelling Neuro: Denies headache, neurologic deficits (focal weakness, numbness, tingling), abnormal gait Psych: Denies anxiety, depression, SI/HI/AVH Skin: Denies new rashes or lesions ID: Denies sick contacts, exotic exposures, travel  Objective: Vitals:   11/25/18 2100 11/25/18 2150 11/25/18 2303 11/26/18 0438  BP: (!) 168/87 (!) 150/83 (!) 148/81 140/89  Pulse: 79 78 75 83  Resp: Temp:   97.9 F (36.6 C) 98.3 F (36.8 C)  TempSrc:   Oral Oral  SpO2: 96% 95% 97% 96%  Weight:      Height:        Intake/Output Summary (Last 24 hours) at 11/26/2018 1125 Last data filed at 11/26/2018 0600 Gross per 24 hour  Intake 643.34 ml  Output -  Net 643.34 ml   Filed Weights   11/25/18 2017  Weight: 54.9 kg    Examination:  General exam: Appears calm and comfortable  Respiratory system: Clear to auscultation. Respiratory effort normal. Cardiovascular system: S1 & S2 heard, RRR. No JVD, murmurs, rubs, gallops or clicks. No pedal edema. Gastrointestinal system: Abdomen is nondistended, soft and nontender. No organomegaly or masses felt. Normal bowel sounds heard. Central nervous system: Alert and oriented. No focal neurological deficits. Extremities: Symmetric 4+ x 5 power.  Skin: No rashes, lesions or ulcers Psychiatry: Judgement and insight appear normal. Mood & affect appropriate.     Data Reviewed:   CBC: Recent Labs  Lab 11/25/18 1723  11/26/18 0508  WBC 10.2 6.0  NEUTROABS 7.6  --   HGB 13.4 12.1  HCT 43.5 40.5  MCV 93.1 95.7  PLT 412* 327   Basic Metabolic Panel: Recent Labs  Lab 11/25/18 1723 11/25/18 2045 11/26/18 0508  NA 145  --  146*  K 3.4*  --  3.7  CL 108  --  112*  CO2 27  --  27  GLUCOSE 116*  --  99  BUN 13  --  11  CREATININE 1.19*  --  1.07*  CALCIUM 8.7*  --  8.5*  MG  --  2.2  --    GFR: Estimated Creatinine Clearance: 35.1 mL/min (A) (by C-G formula based on SCr of 1.07 mg/dL (H)). Liver Function Tests: Recent Labs  Lab 11/25/18 1723  AST 46*  ALT 27  ALKPHOS 73  BILITOT 0.6  PROT 7.7  ALBUMIN 3.1*   No results for input(s): LIPASE, AMYLASE in the last 168 hours. No results for input(s): AMMONIA in the last 168 hours. Coagulation Profile: No results for input(s): INR, PROTIME in the last 168 hours. Cardiac Enzymes: Recent Labs  Lab 11/25/18 2030 11/26/18 0508  TROPONINI <0.03 <0.03   BNP (last 3 results) No results for input(s): PROBNP in the last 8760 hours. HbA1C: No results for input(s): HGBA1C in the last 72 hours. CBG: No results for input(s): GLUCAP in the last 168 hours. Lipid Profile: No results for input(s): CHOL, HDL, LDLCALC, TRIG, CHOLHDL, LDLDIRECT in the last 72 hours. Thyroid Function Tests: No results for input(s): TSH, T4TOTAL, FREET4, T3FREE, THYROIDAB in the last 72 hours. Anemia Panel: No results for input(s): VITAMINB12, FOLATE, FERRITIN, TIBC, IRON, RETICCTPCT in the last 72 hours. Sepsis Labs: No results for input(s): PROCALCITON, LATICACIDVEN in the last 168 hours.  No results found for this or any previous visit (from the past 240 hour(s)).       Radiology Studies: Dg Chest 2 View  Result Date: 11/25/2018 CLINICAL DATA:  Weakness, dyspnea on exertion EXAM: CHEST - 2 VIEW COMPARISON:  None. FINDINGS: Normal heart size. Normal mediastinal contour. No pneumothorax. No pleural effusion. No pulmonary edema. Mild platelike bibasilar  scarring versus atelectasis. No acute consolidative airspace disease. IMPRESSION: Mild platelike bibasilar scarring versus atelectasis. Otherwise no active disease in the chest. Electronically Signed   By: Delbert Phenix M.D.   On: 11/25/2018 18:38   Ct Angio Chest Pe W/cm &/or Wo Cm  Result Date: 11/25/2018 CLINICAL DATA:  Chest pain. EXAM: CT ANGIOGRAPHY CHEST WITH CONTRAST TECHNIQUE: Multidetector CT imaging of the chest was performed using the standard protocol during bolus administration of intravenous contrast. Multiplanar CT image reconstructions and MIPs were obtained to evaluate the vascular anatomy. CONTRAST:  51mL ISOVUE-370 IOPAMIDOL (ISOVUE-370) INJECTION 76% COMPARISON:  Chest x-ray November 25, 2018 FINDINGS: Cardiovascular: No aneurysm or dissection seen in the thoracic aorta with mild atherosclerotic change. The heart size is normal. Mild coronary artery calcifications in the left coronary arteries. Pulmonary emboli are identified in multiple branches of the right upper lobe pulmonary arteries and in multiple right lower lobe branches. Lobar branches are involved on the right. The clot burden extends into the periphery of the right main pulmonary artery. A small subsegmental embolus is seen on the left on series 6, image 104. Mediastinum/Nodes: There is a small  right pleural effusion and a small pericardial effusion. Thyroid and esophagus are unremarkable. No adenopathy. The chest wall is unremarkable. Lungs/Pleura: Central airways are normal. No pneumothorax. Small right-sided pleural effusion. Opacities in the dependent portions of the lung bases are seen, largely explained by atelectasis. Some of the opacities are a little more rounded and developing infarcts given known pulmonary emboli are not excluded. Opacity in the right middle lobe is thought to be atelectasis. No other acute abnormalities within the lungs. Upper Abdomen: No acute abnormality. Musculoskeletal: No chest wall abnormality. No  acute or significant osseous findings. Review of the MIP images confirms the above findings. IMPRESSION: 1. Pulmonary emboli are identified, right greater than left. Lobar emboli are seen in the right upper and lower lobes with a small subsegmental at thrombus on the left. The most central embolus on the right reaches the periphery of the right main pulmonary artery. The right ventricle measures 4.1 cm in greatest dimension and the left measures 5.0 cm. The RV/LV ratio is 0.82 with no convincing evidence of heart strain. 2. Bibasilar opacities are largely explained by atelectasis. However, mildly rounded opacities in the right base could represent early developing infarcts. Recommend attention on follow-up. 3. Mild atherosclerotic change in the nonaneurysmal aorta. Mild coronary artery calcifications. 4. Small pleural effusion. Findings called to Dr. Madilyn Hook. Aortic Atherosclerosis (ICD10-I70.0). Electronically Signed   By: Gerome Sam III M.D   On: 11/25/2018 20:02   Ct Renal Stone Study  Result Date: 11/25/2018 CLINICAL DATA:  Blood found in underwear, uncertain source, weakness, decreased appetite, lethargy EXAM: CT ABDOMEN AND PELVIS WITHOUT CONTRAST TECHNIQUE: Multidetector CT imaging of the abdomen and pelvis was performed following the standard protocol without IV contrast. COMPARISON:  None. FINDINGS: Lower chest: Small right pleural effusion with multiple opacities, including 2 subpleural opacities of the right lung base, 1 demonstrating central clearing (series 2, image 11). Hepatobiliary: No focal liver abnormality is seen. No gallstones, gallbladder wall thickening, or biliary dilatation. Pancreas: Unremarkable. No pancreatic ductal dilatation or surrounding inflammatory changes. Spleen: Normal in size without focal abnormality. Adrenals/Urinary Tract: Adrenal glands are unremarkable. Large simple appearing cyst of the right kidney. Kidneys are otherwise normal, without renal calculi, focal lesion,  or hydronephrosis. Bladder is unremarkable. Stomach/Bowel: Stomach is within normal limits. Incidental duodenal diverticulum. Appendix not clearly visualized and may be surgically absent. No evidence of bowel wall thickening, distention, or inflammatory changes. Sigmoid diverticulosis. Vascular/Lymphatic: No significant vascular findings are present. No enlarged abdominal or pelvic lymph nodes. Reproductive: No mass or other abnormality. Status post hysterectomy. Other: No abdominal wall hernia or abnormality. No abdominopelvic ascites. Musculoskeletal: No acute or significant osseous findings. IMPRESSION: 1. No non-contrast CT findings of the abdomen or pelvis to explain bleeding. Sigmoid diverticulosis without evidence of acute diverticulitis. Diverticulosis is a common source of GI bleeding. 2. Small right pleural effusion with multiple opacities including two subpleural opacities in the right lung base, one demonstrating central clearing. These are of uncertain nature. Differential considerations are broad and include infection, other inflammatory etiology such as organizing pneumonia, or pulmonary embolism with developing pulmonary infarcts. Correlate with clinical presentation and consider dedicated CT imaging of the chest to further evaluate. Electronically Signed   By: Lauralyn Primes M.D.   On: 11/25/2018 18:33   Vas [place] Lower Extremity Venous (dvt)  Result Date: 11/26/2018  Lower Venous Study Indications: Pulmonary embolism.  Risk Factors: Confirmed PE. Performing Technologist: Toma Deiters RVS  Examination Guidelines: A complete evaluation includes B-mode imaging, spectral Doppler,  color Doppler, and power Doppler as needed of all accessible portions of each vessel. Bilateral testing is considered an integral part of a complete examination. Limited examinations for reoccurring indications may be performed as noted.  Right Venous Findings:  +---------+---------------+---------+-----------+----------+-------+          CompressibilityPhasicitySpontaneityPropertiesSummary +---------+---------------+---------+-----------+----------+-------+ CFV      Full           Yes      Yes                          +---------+---------------+---------+-----------+----------+-------+ SFJ      Full                                                 +---------+---------------+---------+-----------+----------+-------+ FV Prox  Full           Yes      Yes                          +---------+---------------+---------+-----------+----------+-------+ FV Mid   Full                                                 +---------+---------------+---------+-----------+----------+-------+ FV DistalFull           Yes      Yes                          +---------+---------------+---------+-----------+----------+-------+ PFV      Full           Yes      Yes                          +---------+---------------+---------+-----------+----------+-------+ POP      Full           Yes      Yes                          +---------+---------------+---------+-----------+----------+-------+ PTV      Full                                                 +---------+---------------+---------+-----------+----------+-------+ PERO     Full                                                 +---------+---------------+---------+-----------+----------+-------+  Left Venous Findings: +---------+---------------+---------+-----------+----------+-------------------+          CompressibilityPhasicitySpontaneityPropertiesSummary             +---------+---------------+---------+-----------+----------+-------------------+ CFV      Full                                                             +---------+---------------+---------+-----------+----------+-------------------+  SFJ      Full                                                              +---------+---------------+---------+-----------+----------+-------------------+ FV Prox  Full                                                             +---------+---------------+---------+-----------+----------+-------------------+ FV Mid   Full                                                             +---------+---------------+---------+-----------+----------+-------------------+ FV DistalPartial                                      Acute               +---------+---------------+---------+-----------+----------+-------------------+ PFV      Full                                                             +---------+---------------+---------+-----------+----------+-------------------+ POP      Partial                                      Acute               +---------+---------------+---------+-----------+----------+-------------------+ PTV      Partial                                      Acute               +---------+---------------+---------+-----------+----------+-------------------+ PERO                                                  Unable to visualize                                                       well enough to  evaluate            +---------+---------------+---------+-----------+----------+-------------------+  Left Technical Findings: Not visualized segments include Peroneal. Unable to visualize well enough to evaluate   Summary: Right: There is no evidence of deep vein thrombosis in the lower extremity. No cystic structure found in the popliteal fossa. Left: Findings consistent with acute deep vein thrombosis involving the left femoral vein, left popliteal vein, and left posterior tibial vein. No cystic structure found in the popliteal fossa. See technical findings listed above.  *See table(s) above for measurements and observations.    Preliminary          Scheduled Meds: . feeding supplement (ENSURE ENLIVE)  237 mL Oral BID BM  . levothyroxine  50 mcg Oral QAC breakfast  . memantine  10 mg Oral BID  . pantoprazole  40 mg Oral Daily   Continuous Infusions: . heparin 900 Units/hr (11/25/18 2040)     LOS: 0 days   Time spent= 25 mins    Trasean Delima Joline Maxcyhirag Leliana Kontz, MD [facility]  If 7PM-7AM, please contact night-coverage [email or web address] 11/26/2018, 11:25 AM

## 2018-11-26 NOTE — Care Management Note (Signed)
Case Management Note  Patient Details  Name: Stephanie Hart MRN: [id number] Date of Birth: [birth year]  Subjective/Objective: From home. PE. CM referral for benefit check on Eliquis/Xarelto-await response from [facility] for outcome.                   Action/Plan:dc home.   Expected Discharge Date:                  Expected Discharge Plan:  Home/Self Care  In-House Referral:     Discharge planning Services  CM Consult  Post Acute Care Choice:    Choice offered to:     DME Arranged:    DME Agency:     HH Arranged:    HH Agency:     Status of Service:  Completed, signed off  If discussed at [employer] of Stay Meetings, dates discussed:    Additional Comments:  Lanier Clam, RN 11/26/2018, 11:38 AM

## 2018-11-26 NOTE — Care Management (Signed)
#  8.   S/W COURTNEY  @ [facility] #  [phone] OPT- MEMBER   APIXABAN : NON-FORMULARY  1. ELIQUIS  5 MG BID COVER- YES CO-PAY- $ 218.50 TIER- 3 DRUG PRIOR APPROVAL- NO  RIVAROXABAN : NON-FORMULARY  2. XARELTO   15 MG BID COVER- YES CO-PAY- $ 218.50  Q/L  ONE PILL PER DAY TIER- 3 DRUG PRIOR APPROVAL- YES [id number]  3. XARELTO   20 MG DAILY COVER- YES CO-PAY- $ 218.00   Q/L ONE PILL PER DAY TIER- 3 DRUG PRIOR APPROVAL- NO  DEDUCTIBLE : NOT MET   PREFERRED PHARMACY : YES [facility]   AND  [facility]

## 2018-11-26 NOTE — Progress Notes (Signed)
  Echocardiogram 2D Echocardiogram has been performed.  Stephanie Hart 11/26/2018, 10:55 AM

## 2018-11-26 NOTE — Progress Notes (Signed)
ANTICOAGULATION CONSULT NOTE - Consult  Pharmacy Consult for heparin Indication: pulmonary embolus  Allergies  Allergen Reactions  . Donepezil Other (See Comments)    Behavioral problems  . Corn Oil Other (See Comments)    Gas, Diarrhea  . Lactobacillus  [Acidophilus] Other (See Comments)    Gas and Diarrhea    Patient Measurements: Height: 5' 1.5" (156.2 cm) Weight: 121 lb 2 oz (54.9 kg) IBW/kg (Calculated) : 48.95 Heparin Dosing Weight: 54.9 kg  Vital Signs: Temp: 98.3 F (36.8 C) (03/09 0438) Temp Source: Oral (03/09 0438) BP: 140/89 (03/09 0438) Pulse Rate: 83 (03/09 0438)  Labs: Recent Labs    11/25/18 1723 11/25/18 2030 11/26/18 0508  HGB 13.4  --  12.1  HCT 43.5  --  40.5  PLT 412*  --  327  HEPARINUNFRC  --   --  0.46  CREATININE 1.19*  --  1.07*  TROPONINI  --  <0.03 <0.03    Estimated Creatinine Clearance: 35.1 mL/min (A) (by C-G formula based on SCr of 1.07 mg/dL (H)).   Medical History: Past Medical History:  Diagnosis Date  . Dementia (HCC)   . Thyroid disease     Assessment: 53 yof presenting with weakness and dyspnea on exertion. No AC PTA. CTA showing PE w/o convincing evidence of R heart strain.  Today, 11/26/2018  First heparin level therapeutic (0.46) on 900 units/hr  CBC: Hgb 12.1, Plts wnl  No bleeding or complications reported  Goal of Therapy:  Heparin level 0.3-0.7 units/ml Monitor platelets by anticoagulation protocol: Yes   Plan:   Continue heparin drip at 900 units/hr  Recheck heparin level in 8hr to confirm therapeutic  Daily CBC while on heparin   Follow up plans for long-term anticoagulation  Loralee Pacas, PharmD, BCPS Pager: [phone] 11/26/2018,8:45 AM

## 2018-11-26 NOTE — Discharge Instructions (Signed)
Information on my medicine - ELIQUIS (apixaban)  Why was Eliquis prescribed for you? Eliquis was prescribed to treat blood clots that may have been found in the veins of your legs (deep vein thrombosis) or in your lungs (pulmonary embolism) and to reduce the risk of them occurring again.  What do You need to know about Eliquis ? The starting dose is 10 mg (two 5 mg tablets) taken TWICE daily for the FIRST SEVEN (7) DAYS, then on 12/03/2018  the dose is reduced to ONE 5 mg tablet taken TWICE daily.  Eliquis may be taken with or without food.   Try to take the dose about the same time in the morning and in the evening. If you have difficulty swallowing the tablet whole please discuss with your pharmacist how to take the medication safely.  Take Eliquis exactly as prescribed and DO NOT stop taking Eliquis without talking to the doctor who prescribed the medication.  Stopping may increase your risk of developing a new blood clot.  Refill your prescription before you run out.  After discharge, you should have regular check-up appointments with your healthcare provider that is prescribing your Eliquis.    What do you do if you miss a dose? If a dose of ELIQUIS is not taken at the scheduled time, take it as soon as possible on the same day and twice-daily administration should be resumed. The dose should not be doubled to make up for a missed dose.  Important Safety Information A possible side effect of Eliquis is bleeding. You should call your healthcare provider right away if you experience any of the following: ? Bleeding from an injury or your nose that does not stop. ? Unusual colored urine (red or dark brown) or unusual colored stools (red or black). ? Unusual bruising for unknown reasons. ? A serious fall or if you hit your head (even if there is no bleeding).  Some medicines may interact with Eliquis and might increase your risk of bleeding or clotting while on Eliquis. To help  avoid this, consult your healthcare provider or pharmacist prior to using any new prescription or non-prescription medications, including herbals, vitamins, non-steroidal anti-inflammatory drugs (NSAIDs) and supplements.  This website has more information on Eliquis (apixaban): http://www.eliquis.com/eliquis/home

## 2018-11-26 NOTE — Care Management Obs Status (Signed)
MEDICARE OBSERVATION STATUS NOTIFICATION   Patient Details  Name: Stephanie Hart MRN: [id number] Date of Birth: 08-13-1941   Medicare Observation Status Notification Given:  Yes    MahabirOlegario Messier, RN 11/26/2018, 2:12 PM

## 2018-11-26 NOTE — Progress Notes (Signed)
ANTICOAGULATION CONSULT NOTE - Consult  Pharmacy Consult for Transition to Eliquis Indication: pulmonary embolus  Allergies  Allergen Reactions  . Donepezil Other (See Comments)    Behavioral problems  . Corn Oil Other (See Comments)    Gas, Diarrhea  . Lactobacillus  [Acidophilus] Other (See Comments)    Gas and Diarrhea    Patient Measurements: Height: 5' 1.5" (156.2 cm) Weight: 121 lb 2 oz (54.9 kg) IBW/kg (Calculated) : 48.95 Heparin Dosing Weight: 54.9 kg  Vital Signs: Temp: 98.3 F (36.8 C) (03/09 0438) Temp Source: Oral (03/09 0438) BP: 133/63 (03/09 1237) Pulse Rate: 76 (03/09 1237)  Labs: Recent Labs    11/25/18 1723 11/25/18 2030 11/26/18 0508  HGB 13.4  --  12.1  HCT 43.5  --  40.5  PLT 412*  --  327  HEPARINUNFRC  --   --  0.46  CREATININE 1.19*  --  1.07*  TROPONINI  --  <0.03 <0.03    Estimated Creatinine Clearance: 35.1 mL/min (A) (by C-G formula based on SCr of 1.07 mg/dL (H)).   Medical History: Past Medical History:  Diagnosis Date  . Dementia (HCC)   . Thyroid disease     Assessment: 36 yof presenting with weakness and dyspnea on exertion. No AC PTA. CTA showing PE w/o convincing evidence of R heart strain.  Today, 11/26/2018  Doctor requested transition from heparin to Eliquis  Most recent heparin level therapeutic at 900 unit/hr; no bleeding or infusion issues per RN  Adequate renal function  CBC: Hgb and Plts wnl  Goal of Therapy:  Prevent thrombus extension and recurrence    Plan:   Discontinue heparin. Then initiate Eliquis 10 mg PO BID x 7 days, then 5 mg PO BID thereafter  F/u CBC and Cr as appropriate   Provided patient with medication education and coupon voucher   Carolin Sicks, PharmD Candidate [facility], Class of [employer]2 11/26/2018,2:14 PM

## 2018-11-26 NOTE — Progress Notes (Signed)
Initial Nutrition Assessment  DOCUMENTATION CODES:   Not applicable  INTERVENTION:  - Will decrease Ensure Enlive po BID, each supplement provides 350 kcal and 20 grams of protein - Will order Boost Breeze po TID, each supplement provides 250 kcal and 9 grams of protein - Will order daily multivitamin with minerals. - Continue to encourage PO intakes.   NUTRITION DIAGNOSIS:   Inadequate oral intake related to acute illness, lethargy/confusion as evidenced by per patient/family report  GOAL:   Patient will meet greater than or equal to 90% of their needs  MONITOR:   PO intake, Supplement acceptance, Weight trends, Labs  REASON FOR ASSESSMENT:   Malnutrition Screening Tool  ASSESSMENT:   78 y.o. female with medical history significant of dementia and hypothyroidism. She was transferred to [facility] after presenting to [facility] with a week history of progressively worse weakness, decreased appetite, and increased sleepiness for 1-2 weeks. Her granddaughter explained that she is usually able to do her own toileting, but has been unable to do so in recent days. Family noticed that she has some bloody staining in her underwear (patient has had a hysterectomy).  BMI indicates normal weight. No intakes documented since admission. Patient noted to be a/o to self only although she is able to answer questions surrounding the current time appropriately. She does not recall if she ate breakfast and no family/visitors present. RN at lunch and tech was helping another patient. Per review of [facility], breakfast meal was delivered and provided 185 kcal, 3 grams of protein. Lunch meal is ordered and expected to be delivered by 12:30 PM.   Patient denies abdominal or nausea at the time of RD visit. Noted that she has lower dentures. Patient reports needing to get upper dentures and that current pair of lower dentures move around/do not fit well so she needs a new set of lower dentures.    Ensure Enlive was ordered BID per ONS protocol at the time of admission, but per review of order patient refused bottle this AM.   Per chart review, current weight is 121 lb. Weight on 08/21/18 at [facility] was 132 lb. This indicates 11 lb weight loss (8% body weight) in the past 3 months; significant for time frame. Patient is at high risk for malnutrition.   Medications reviewed; 50 mcg oral synthroid/day, 2 g IV Mg sulfate x1 run 3/8, 40 mEq K-Dur x1 dose 3/8. Labs reviewed; Na: 146 mmol/l, Cl: 112 mmol/l, creatinine: 1.07 mg/dl, Ca: 8.5 mg/dl, GFR: 51 ml/min.      NUTRITION - FOCUSED PHYSICAL EXAM:    Most Recent Value  Orbital Region  No depletion  Upper Arm Region  No depletion  Thoracic and Lumbar Region  Unable to assess  Buccal Region  Mild depletion  Temple Region  No depletion  Clavicle Bone Region  Mild depletion  Clavicle and Acromion Bone Region  No depletion  Scapular Bone Region  Unable to assess  Dorsal Hand  No depletion  Patellar Region  No depletion  Anterior Thigh Region  Unable to assess  Posterior Calf Region  No depletion  Edema (RD Assessment)  None  Hair  Reviewed  Eyes  Reviewed  Mouth  Reviewed [ill fitting dentures]  Skin  Reviewed  Nails  Reviewed       Diet Order:   Diet Order            Diet Heart Room service appropriate? Yes; Fluid consistency: Thin  Diet effective now  EDUCATION NEEDS:   No education needs have been identified at this time  Skin:  Skin Assessment: Reviewed RN Assessment  Last BM:  3/8  Height:   Ht Readings from Last 1 Encounters:  11/25/18 5' 1.5" (1.562 m)    Weight:   Wt Readings from Last 1 Encounters:  11/25/18 54.9 kg    Ideal Body Weight:  48.86 kg  BMI:  Body mass index is 22.52 kg/m.  Estimated Nutritional Needs:   Kcal:  1400-1600 kcal  Protein:  55-65 grams  Fluid:  >/= 1.7 L/day     Trenton Gammon, MS, RD, LDN, [facility] Inpatient Clinical Dietitian Pager #  [phone] After hours/weekend pager # [phone]

## 2018-11-26 NOTE — Progress Notes (Signed)
   11/26/18 1445  Clinical Encounter Type  Visited With Patient and family together  Visit Type Initial;Other (Comment) (AD)  Referral From Nurse  Consult/Referral To Chaplain  The chaplain responded to RN-Sara's referral for Pt. and daughter information on an Advance Directive.  The chaplain checked the chart before entering the Pt. room. The Pt. was sitting up in the room with her family members when the chaplain arrived.  The Pt. was not able to identify herself or her daughter when the chaplain initially asked. The Pt. daughter-Tonya identified today's interactions with the chaplain as the Pt.'s baseline. The chaplain informed the Pt. and daughter of the decisional roles of HCPOA and [place] Hierarchy of Decision Makers.  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  The chaplain explained the she was unable to complete the legal document because of the Pt. lack of decisional capacity. The uncompleted educational document was left in the Pt.'s room.  The chaplain will provide F/U spiritual care as needed.

## 2018-11-27 DIAGNOSIS — I82402 Acute embolism and thrombosis of unspecified deep veins of left lower extremity: Secondary | ICD-10-CM | POA: Diagnosis not present

## 2018-11-27 DIAGNOSIS — F0390 Unspecified dementia without behavioral disturbance: Secondary | ICD-10-CM | POA: Diagnosis not present

## 2018-11-27 DIAGNOSIS — E039 Hypothyroidism, unspecified: Secondary | ICD-10-CM | POA: Diagnosis not present

## 2018-11-27 DIAGNOSIS — I2699 Other pulmonary embolism without acute cor pulmonale: Secondary | ICD-10-CM | POA: Diagnosis not present

## 2018-11-27 LAB — BASIC METABOLIC PANEL
Anion gap: 8 (ref 5–15)
BUN: 14 mg/dL (ref 8–23)
CO2: 25 mmol/L (ref 22–32)
Calcium: 8.4 mg/dL — ABNORMAL LOW (ref 8.9–10.3)
Chloride: 109 mmol/L (ref 98–111)
Creatinine, Ser: 1.03 mg/dL — ABNORMAL HIGH (ref 0.44–1.00)
GFR calc Af Amer: >60 mL/min (ref >60)
GFR calc non Af Amer: 53 mL/min — ABNORMAL LOW (ref >60)
Glucose, Bld: 116 mg/dL — ABNORMAL HIGH (ref 70–99)
Potassium: 3.9 mmol/L (ref 3.5–5.1)
Sodium: 142 mmol/L (ref 135–145)

## 2018-11-27 LAB — CBC
HCT: 38 % (ref 36.0–46.0)
HEMOGLOBIN: 11.9 g/dL — AB (ref 12.0–15.0)
MCH: 29 pg (ref 26.0–34.0)
MCHC: 31.3 g/dL (ref 30.0–36.0)
MCV: 92.7 fL (ref 80.0–100.0)
Platelets: 288 10*3/uL (ref 150–400)
RBC: 4.1 MIL/uL (ref 3.87–5.11)
RDW: 13.3 % (ref 11.5–15.5)
WBC: 5.3 10*3/uL (ref 4.0–10.5)
nRBC: 0 % (ref 0.0–0.2)

## 2018-11-27 LAB — URINE CULTURE

## 2018-11-27 LAB — MAGNESIUM: Magnesium: 2.1 mg/dL (ref 1.7–2.4)

## 2018-11-27 MED ORDER — PANTOPRAZOLE SODIUM 40 MG PO TBEC: 40.0000 mg | DELAYED_RELEASE_TABLET | Freq: Every day | ORAL | 0 refills | Status: AC

## 2018-11-27 MED ORDER — APIXABAN 5 MG PO TABS: ORAL_TABLET | ORAL | 0 refills | Status: DC

## 2018-11-27 NOTE — Evaluation (Signed)
Occupational Therapy Evaluation Patient Details Name: Stephanie Hart MRN: [id number] DOB: 1941-08-24 Today's Date: 11/27/2018    History of Present Illness 78 year old with history of dementia, hypothyroidism was transferred to [facility] from [facility] for evaluation of generalized weakness and decreased appetite.  Pt found to have acute bilateral pulmonary embolism without cor pulmonale and acute L LE DVTs   Clinical Impression   Pt was admitted for the above. She lives with daughter at baseline; unsure if she had any assistance for adls.  Pt needs mostly min guard at this time. Will follow in acute setting with supervision level goals.      Follow Up Recommendations  SNF;Supervision/Assistance - 24 hour;Home health OT    Equipment Recommendations  None recommended by OT    Recommendations for Other Services       Precautions / Restrictions Precautions Precautions: Fall Restrictions Weight Bearing Restrictions: No      Mobility Bed Mobility               General bed mobility comments: oob  Transfers   Equipment used: None   Sit to Stand: Min guard         General transfer comment: for safety    Balance                                           ADL either performed or assessed with clinical judgement   ADL Overall ADL's : Needs assistance/impaired Eating/Feeding: Independent   Grooming: Set up;Supervision/safety   Upper Body Bathing: Set up;Supervision/ safety   Lower Body Bathing: Min guard;Sit to/from stand   Upper Body Dressing : Minimal assistance   Lower Body Dressing: Min guard;Sit to/from stand   Toilet Transfer: Min guard;Ambulation;Comfort height toilet   Toileting- Clothing Manipulation and Hygiene: Min guard;Sit to/from stand         General ADL Comments: pt was in bathroom with NT when OT arrived. Assisted with adl and ambulated back to chair     Vision         Perception     Praxis      Pertinent  Vitals/Pain Pain Assessment: No/denies pain     Hand Dominance     Extremity/Trunk Assessment Upper Extremity Assessment Upper Extremity Assessment: Generalized weakness           Communication Communication Communication: No difficulties   Cognition Arousal/Alertness: Awake/alert Behavior During Therapy: WFL for tasks assessed/performed Overall Cognitive Status: History of cognitive impairments - at baseline                                 General Comments: follows commands in context   General Comments       Exercises     Shoulder Instructions      Home Living Family/patient expects to be discharged to:: Unsure Living Arrangements: Children                           Home Equipment: None          Prior Functioning/Environment          Comments: unsure of PLOF with adls        OT Problem List: Decreased strength;Impaired balance (sitting and/or standing);Decreased cognition;Decreased safety awareness      OT Treatment/Interventions:  Self-care/ADL training;Therapeutic activities;Cognitive remediation/compensation;Patient/family education;Balance training    OT Goals(Current goals can be found in the care plan section) Acute Rehab OT Goals Patient Stated Goal: none stated OT Goal Formulation: Patient unable to participate in goal setting Time For Goal Achievement: 12/11/18 Potential to Achieve Goals: Fair ADL Goals Pt Will Transfer to Toilet: with supervision;ambulating;regular height toilet;bedside commode(vs) Pt Will Perform Toileting - Clothing Manipulation and hygiene: with supervision;sit to/from stand Additional ADL Goal #1: pt will complete adl with set up/supervision, no more than 2 cues for completeness/safety  OT Frequency: Min 2X/week   Barriers to D/C:            Co-evaluation              AM-PAC OT "6 Clicks" Daily Activity     Outcome Measure Help from another person eating meals?: None Help from  another person taking care of personal grooming?: A Little Help from another person toileting, which includes using toliet, bedpan, or urinal?: A Little Help from another person bathing (including washing, rinsing, drying)?: A Little Help from another person to put on and taking off regular upper body clothing?: A Little Help from another person to put on and taking off regular lower body clothing?: A Little 6 Click Score: 19   End of Session    Activity Tolerance: Patient tolerated treatment well Patient left: in chair;with call bell/phone within reach;with chair alarm set  OT Visit Diagnosis: Unsteadiness on feet (R26.81)                Time: 4332-9518 OT Time Calculation (min): 17 min Charges:  OT General Charges $OT Visit: 1 Visit OT Evaluation $OT Eval Low Complexity: 1 Low  Stephanie Hart, OTR/L Acute Rehabilitation Services [phone] [facility] pager [phone] office 11/27/2018  Stephanie Hart 11/27/2018, 10:33 AM

## 2018-11-27 NOTE — Discharge Summary (Signed)
Physician Discharge Summary  Stephanie Hart ZOX:[id number] DOB: [birth year] DOA: 11/25/2018  PCP: [name], PA  Admit date: 11/25/2018 Discharge date: 11/27/2018  Admitted From: Home Disposition: Home with home health  Recommendations for Outpatient Follow-up:  1. Follow up with PCP in 1-2 weeks 2. Please obtain BMP/CBC in one week your next doctors visit.  3. Twice a day for 7 days followed by 5 mg orally twice daily thereafter  Home Health: Arrangements made by case manager Equipment/Devices: None Discharge Condition: Stable CODE STATUS: Full code Diet recommendation: Regular  Brief/Interim Summary: 78 year old with history of dementia, hypothyroidism was transferred here from med [facility] for evaluation of generalized weakness and decreased appetite.  Vital signs are stable.  Most of the labs are overall unremarkable except CTA of the chest showed bilateral pulmonary embolism.  Patient was started on heparin drip.  Lower extremity Dopplers on the left side was positive for acute DVT, echocardiogram did not show any acute wall motion abnormality or heart strain.  After an extensive discussion with the patient, she determined to take Eliquis for her treatment.  Arrangements were made with the help of case management and pharmacy. She was divided by physical therapy who recommended home health therefore arrangements were made.  Stable for discharge today.  She is not hypoxic or short of breath.  Denies any dizziness, ambulating without any issues on room air.    Discharge Diagnoses:  Principal Problem:   Pulmonary emboli (HCC) Active Problems:   Dementia (HCC)   Hypothyroidism   Hypokalemia  Acute bilateral pulmonary embolism without cor pulmonale Acute left-sided DVT - Heparin drip transition to Eliquis after extensive discussion with the patient regarding risks and benefit and also discussed other anticoagulation such as Xarelto and Coumadin with her as well.   Echocardiogram does not show any wall motion abnormality, lower extremity Dopplers positive for left-sided DVT. - Currently she remains chest pain-free, no lightheadedness no dizziness, no chest pain.  Stable for discharge.  She is on room air.  Generalized weakness -Divided by physical therapy recommended home health therefore arrangements were made  History of dementia without behavioral disturbances -Resume home meds  Hypothyroidism -Continue Synthroid  On DVT treatment during the hospitalization Full code Daughter did not answer the phone today when I called but I have left her voicemail requesting to call back if she has any questions. Stable for discharge  Consultations:  None  Subjective: Feels better, no complaints  Discharge Exam: Vitals:   11/26/18 2125 11/27/18 0447  BP: (!) 158/78 (!) 150/80  Pulse: 81 88  Resp:    Temp: 98.7 F (37.1 C) 98.5 F (36.9 C)  SpO2: 97% 95%   Vitals:   11/26/18 1237 11/26/18 2035 11/26/18 2125 11/27/18 0447  BP: 133/63  (!) 158/78 (!) 150/80  Pulse: 76 76 81 88  Resp:  16    Temp:   98.7 F (37.1 C) 98.5 F (36.9 C)  TempSrc:   Oral Oral  SpO2: 97%  97% 95%  Weight:      Height:        General: Pt is alert, awake, not in acute distress Cardiovascular: RRR, S1/S2 +, no rubs, no gallops Respiratory: CTA bilaterally, no wheezing, no rhonchi Abdominal: Soft, NT, ND, bowel sounds + Extremities: no edema, no cyanosis  Discharge Instructions   Allergies as of 11/27/2018      Reactions   Donepezil Other (See Comments)   Behavioral problems   Corn Oil Other (See Comments)  Gas, Diarrhea   Lactobacillus  [acidophilus] Other (See Comments)   Gas and Diarrhea      Medication List    TAKE these medications   apixaban 5 MG Tabs tablet Commonly known as:  ELIQUIS Take 2 tablets (10 mg total) by mouth 2 (two) times daily for 7 days, THEN 1 tablet (5 mg total) 2 (two) times daily for 23 days. Start taking on:  November 27, 2018   Flax Seed Oil 1000 MG Caps Take 1,000 mg by mouth daily.   levothyroxine 50 MCG tablet Commonly known as:  SYNTHROID, LEVOTHROID Take 50 mcg by mouth daily before breakfast.   memantine 10 MG tablet Commonly known as:  NAMENDA Take 10 mg by mouth 2 (two) times daily.   pantoprazole 40 MG tablet Commonly known as:  PROTONIX Take 1 tablet (40 mg total) by mouth daily for 30 days.   QUEtiapine 25 MG tablet Commonly known as:  SEROQUEL Take 25 mg by mouth 3 (three) times daily as needed (agitation).   vitamin B-12 1000 MCG tablet Commonly known as:  CYANOCOBALAMIN Take 1,000 mcg by mouth daily.   vitamin E 100 UNIT capsule Take 400 Units by mouth daily.            Durable Medical Equipment  (From admission, onward)         Start     Ordered   11/26/18 1542  For home use only DME Walker rolling  Once    Question:  Patient needs a walker to treat with the following condition  Answer:  Unsteady gait   11/26/18 1542         Follow-up Information    [facility].   Specialty:  Home Health Services Why:  HH nursing/physical therapy/occupational therapy/aide,social worker       [facility] Follow up.   Why:  home rolling walker         Allergies  Allergen Reactions  . Donepezil Other (See Comments)    Behavioral problems  . Corn Oil Other (See Comments)    Gas, Diarrhea  . Lactobacillus  [Acidophilus] Other (See Comments)    Gas and Diarrhea    You were cared for by a hospitalist during your hospital stay. If you have any questions about your discharge medications or the care you received while you were in the hospital after you are discharged, you can call the unit and asked to speak with the hospitalist on call if the hospitalist that took care of you is not available. Once you are discharged, your primary care physician will handle any further medical issues. Please note that no refills for any discharge  medications will be authorized once you are discharged, as it is imperative that you return to your primary care physician (or establish a relationship with a primary care physician if you do not have one) for your aftercare needs so that they can reassess your need for medications and monitor your lab values.   Procedures/Studies: Dg Chest 2 View  Result Date: 11/25/2018 CLINICAL DATA:  Weakness, dyspnea on exertion EXAM: CHEST - 2 VIEW COMPARISON:  None. FINDINGS: Normal heart size. Normal mediastinal contour. No pneumothorax. No pleural effusion. No pulmonary edema. Mild platelike bibasilar scarring versus atelectasis. No acute consolidative airspace disease. IMPRESSION: Mild platelike bibasilar scarring versus atelectasis. Otherwise no active disease in the chest. Electronically Signed   By: Delbert Phenix M.D.   On: 11/25/2018 18:38   Ct Angio Chest  Pe W/cm &/or Wo Cm  Result Date: 11/25/2018 CLINICAL DATA:  Chest pain. EXAM: CT ANGIOGRAPHY CHEST WITH CONTRAST TECHNIQUE: Multidetector CT imaging of the chest was performed using the standard protocol during bolus administration of intravenous contrast. Multiplanar CT image reconstructions and MIPs were obtained to evaluate the vascular anatomy. CONTRAST:  77mL ISOVUE-370 IOPAMIDOL (ISOVUE-370) INJECTION 76% COMPARISON:  Chest x-ray November 25, 2018 FINDINGS: Cardiovascular: No aneurysm or dissection seen in the thoracic aorta with mild atherosclerotic change. The heart size is normal. Mild coronary artery calcifications in the left coronary arteries. Pulmonary emboli are identified in multiple branches of the right upper lobe pulmonary arteries and in multiple right lower lobe branches. Lobar branches are involved on the right. The clot burden extends into the periphery of the right main pulmonary artery. A small subsegmental embolus is seen on the left on series 6, image 104. Mediastinum/Nodes: There is a small right pleural effusion and a small pericardial  effusion. Thyroid and esophagus are unremarkable. No adenopathy. The chest wall is unremarkable. Lungs/Pleura: Central airways are normal. No pneumothorax. Small right-sided pleural effusion. Opacities in the dependent portions of the lung bases are seen, largely explained by atelectasis. Some of the opacities are a little more rounded and developing infarcts given known pulmonary emboli are not excluded. Opacity in the right middle lobe is thought to be atelectasis. No other acute abnormalities within the lungs. Upper Abdomen: No acute abnormality. Musculoskeletal: No chest wall abnormality. No acute or significant osseous findings. Review of the MIP images confirms the above findings. IMPRESSION: 1. Pulmonary emboli are identified, right greater than left. Lobar emboli are seen in the right upper and lower lobes with a small subsegmental at thrombus on the left. The most central embolus on the right reaches the periphery of the right main pulmonary artery. The right ventricle measures 4.1 cm in greatest dimension and the left measures 5.0 cm. The RV/LV ratio is 0.82 with no convincing evidence of heart strain. 2. Bibasilar opacities are largely explained by atelectasis. However, mildly rounded opacities in the right base could represent early developing infarcts. Recommend attention on follow-up. 3. Mild atherosclerotic change in the nonaneurysmal aorta. Mild coronary artery calcifications. 4. Small pleural effusion. Findings called to Dr. Madilyn Hook. Aortic Atherosclerosis (ICD10-I70.0). Electronically Signed   By: Gerome Sam III M.D   On: 11/25/2018 20:02   Ct Renal Stone Study  Result Date: 11/25/2018 CLINICAL DATA:  Blood found in underwear, uncertain source, weakness, decreased appetite, lethargy EXAM: CT ABDOMEN AND PELVIS WITHOUT CONTRAST TECHNIQUE: Multidetector CT imaging of the abdomen and pelvis was performed following the standard protocol without IV contrast. COMPARISON:  None. FINDINGS: Lower  chest: Small right pleural effusion with multiple opacities, including 2 subpleural opacities of the right lung base, 1 demonstrating central clearing (series 2, image 11). Hepatobiliary: No focal liver abnormality is seen. No gallstones, gallbladder wall thickening, or biliary dilatation. Pancreas: Unremarkable. No pancreatic ductal dilatation or surrounding inflammatory changes. Spleen: Normal in size without focal abnormality. Adrenals/Urinary Tract: Adrenal glands are unremarkable. Large simple appearing cyst of the right kidney. Kidneys are otherwise normal, without renal calculi, focal lesion, or hydronephrosis. Bladder is unremarkable. Stomach/Bowel: Stomach is within normal limits. Incidental duodenal diverticulum. Appendix not clearly visualized and may be surgically absent. No evidence of bowel wall thickening, distention, or inflammatory changes. Sigmoid diverticulosis. Vascular/Lymphatic: No significant vascular findings are present. No enlarged abdominal or pelvic lymph nodes. Reproductive: No mass or other abnormality. Status post hysterectomy. Other: No abdominal wall hernia or  abnormality. No abdominopelvic ascites. Musculoskeletal: No acute or significant osseous findings. IMPRESSION: 1. No non-contrast CT findings of the abdomen or pelvis to explain bleeding. Sigmoid diverticulosis without evidence of acute diverticulitis. Diverticulosis is a common source of GI bleeding. 2. Small right pleural effusion with multiple opacities including two subpleural opacities in the right lung base, one demonstrating central clearing. These are of uncertain nature. Differential considerations are broad and include infection, other inflammatory etiology such as organizing pneumonia, or pulmonary embolism with developing pulmonary infarcts. Correlate with clinical presentation and consider dedicated CT imaging of the chest to further evaluate. Electronically Signed   By: Lauralyn Primes M.D.   On: 11/25/2018 18:33    Vas [place] Lower Extremity Venous (dvt)  Result Date: 11/26/2018  Lower Venous Study Indications: Pulmonary embolism.  Risk Factors: Confirmed PE. Performing Technologist: Toma Deiters RVS  Examination Guidelines: A complete evaluation includes B-mode imaging, spectral Doppler, color Doppler, and power Doppler as needed of all accessible portions of each vessel. Bilateral testing is considered an integral part of a complete examination. Limited examinations for reoccurring indications may be performed as noted.  Right Venous Findings: +---------+---------------+---------+-----------+----------+-------+          CompressibilityPhasicitySpontaneityPropertiesSummary +---------+---------------+---------+-----------+----------+-------+ CFV      Full           Yes      Yes                          +---------+---------------+---------+-----------+----------+-------+ SFJ      Full                                                 +---------+---------------+---------+-----------+----------+-------+ FV Prox  Full           Yes      Yes                          +---------+---------------+---------+-----------+----------+-------+ FV Mid   Full                                                 +---------+---------------+---------+-----------+----------+-------+ FV DistalFull           Yes      Yes                          +---------+---------------+---------+-----------+----------+-------+ PFV      Full           Yes      Yes                          +---------+---------------+---------+-----------+----------+-------+ POP      Full           Yes      Yes                          +---------+---------------+---------+-----------+----------+-------+ PTV      Full                                                 +---------+---------------+---------+-----------+----------+-------+  PERO     Full                                                  +---------+---------------+---------+-----------+----------+-------+  Left Venous Findings: +---------+---------------+---------+-----------+----------+-------------------+          CompressibilityPhasicitySpontaneityPropertiesSummary             +---------+---------------+---------+-----------+----------+-------------------+ CFV      Full                                                             +---------+---------------+---------+-----------+----------+-------------------+ SFJ      Full                                                             +---------+---------------+---------+-----------+----------+-------------------+ FV Prox  Full                                                             +---------+---------------+---------+-----------+----------+-------------------+ FV Mid   Full                                                             +---------+---------------+---------+-----------+----------+-------------------+ FV DistalPartial                                      Acute               +---------+---------------+---------+-----------+----------+-------------------+ PFV      Full                                                             +---------+---------------+---------+-----------+----------+-------------------+ POP      Partial                                      Acute               +---------+---------------+---------+-----------+----------+-------------------+ PTV      Partial                                      Acute               +---------+---------------+---------+-----------+----------+-------------------+ PERO  Unable to visualize                                                       well enough to                                                            evaluate             +---------+---------------+---------+-----------+----------+-------------------+  Left Technical Findings: Not visualized segments include Peroneal. Unable to visualize well enough to evaluate   Summary: Right: There is no evidence of deep vein thrombosis in the lower extremity. No cystic structure found in the popliteal fossa. Left: Findings consistent with acute deep vein thrombosis involving the left femoral vein, left popliteal vein, and left posterior tibial vein. No cystic structure found in the popliteal fossa. See technical findings listed above.  *See table(s) above for measurements and observations. Electronically signed by Fabienne Bruns MD on 11/26/2018 at 5:05:06 PM.    Final       The results of significant diagnostics from this hospitalization (including imaging, microbiology, ancillary and laboratory) are listed below for reference.     Microbiology: Recent Results (from the past 240 hour(s))  Urine culture     Status: Abnormal   Collection Time: 11/25/18  5:23 PM  Result Value Ref Range Status   Specimen Description   Final    URINE, RANDOM Performed at [facility], [address] [place], [place] [address]    Special Requests   Final    NONE Performed at [facility], [address] [place], [place] [address]    Culture MULTIPLE SPECIES PRESENT, SUGGEST RECOLLECTION (A)  Final   Report Status 11/27/2018 FINAL  Final     Labs: BNP (last 3 results) No results for input(s): BNP in the last 8760 hours. Basic Metabolic Panel: Recent Labs  Lab 11/25/18 1723 11/25/18 2045 11/26/18 0508 11/27/18 0544  NA 145  --  146* 142  K 3.4*  --  3.7 3.9  CL 108  --  112* 109  CO2 27  --  27 25  GLUCOSE 116*  --  99 116*  BUN 13  --  11 14  CREATININE 1.19*  --  1.07* 1.03*  CALCIUM 8.7*  --  8.5* 8.4*  MG  --  2.2  --  2.1   Liver Function Tests: Recent Labs  Lab 11/25/18 1723  AST 46*  ALT 27  ALKPHOS 73  BILITOT 0.6  PROT 7.7  ALBUMIN 3.1*    No results for input(s): LIPASE, AMYLASE in the last 168 hours. No results for input(s): AMMONIA in the last 168 hours. CBC: Recent Labs  Lab 11/25/18 1723 11/26/18 0508 11/27/18 0544  WBC 10.2 6.0 5.3  NEUTROABS 7.6  --   --   HGB 13.4 12.1 11.9*  HCT 43.5 40.5 38.0  MCV 93.1 95.7 92.7  PLT 412* 327 288   Cardiac Enzymes: Recent Labs  Lab 11/25/18 2030 11/26/18 0508  TROPONINI <0.03 <0.03   BNP: Invalid input(s): POCBNP CBG: No results for input(s): GLUCAP in the last 168  hours. D-Dimer No results for input(s): DDIMER in the last 72 hours. Hgb A1c No results for input(s): HGBA1C in the last 72 hours. Lipid Profile No results for input(s): CHOL, HDL, LDLCALC, TRIG, CHOLHDL, LDLDIRECT in the last 72 hours. Thyroid function studies No results for input(s): TSH, T4TOTAL, T3FREE, THYROIDAB in the last 72 hours.  Invalid input(s): FREET3 Anemia work up No results for input(s): VITAMINB12, FOLATE, FERRITIN, TIBC, IRON, RETICCTPCT in the last 72 hours. Urinalysis    Component Value Date/Time   COLORURINE YELLOW 11/25/2018 1723   APPEARANCEUR HAZY (A) 11/25/2018 1723   LABSPEC 1.025 11/25/2018 1723   PHURINE 5.5 11/25/2018 1723   GLUCOSEU NEGATIVE 11/25/2018 1723   HGBUR NEGATIVE 11/25/2018 1723   BILIRUBINUR MODERATE (A) 11/25/2018 1723   KETONESUR 15 (A) 11/25/2018 1723   PROTEINUR NEGATIVE 11/25/2018 1723   NITRITE NEGATIVE 11/25/2018 1723   LEUKOCYTESUR TRACE (A) 11/25/2018 1723   Sepsis Labs Invalid input(s): PROCALCITONIN,  WBC,  LACTICIDVEN Microbiology Recent Results (from the past 240 hour(s))  Urine culture     Status: Abnormal   Collection Time: 11/25/18  5:23 PM  Result Value Ref Range Status   Specimen Description   Final    URINE, RANDOM Performed at [facility], [address] [place], [place] [address]    Special Requests   Final    NONE Performed at [facility], [address] [place], [place] [address]     Culture MULTIPLE SPECIES PRESENT, SUGGEST RECOLLECTION (A)  Final   Report Status 11/27/2018 FINAL  Final     Time coordinating discharge:  I have spent 35 minutes face to face with the patient and on the ward discussing the patients care, assessment, plan and disposition with other care givers. >50% of the time was devoted counseling the patient about the risks and benefits of treatment/Discharge disposition and coordinating care.   SIGNED:   Dimple Nanas, MD  [facility] 11/27/2018, 11:51 AM   If 7PM-7AM, please contact night-coverage [email or web address]

## 2018-11-27 NOTE — Plan of Care (Signed)
Plan of care reviewed.  The patient's limited cognitive ability decreases her processing and understanding of new information.

## 2018-11-27 NOTE — Progress Notes (Signed)
Patient discharged home.  IV removed - WNL.  Reviewed AVS and medications with daughter Archie Patten.  Instructed to follow up with PCP.  Instructed on bleeding precautions due to eliquis - verbalizes understanding.  RW sent home with patient.  No questions at this time, assisted off unit in NAD

## 2018-11-29 NOTE — Discharge Summary (Signed)
Refer to Discharge summary 11/27/18 11:33am, its mistakenly labled as Progress note.

## 2019-06-24 ENCOUNTER — Emergency Department (HOSPITAL_COMMUNITY): Payer: Medicare HMO

## 2019-06-24 ENCOUNTER — Encounter (HOSPITAL_COMMUNITY): Payer: Self-pay | Admitting: Emergency Medicine

## 2019-06-24 ENCOUNTER — Emergency Department (HOSPITAL_COMMUNITY)
Admission: EM | Admit: 2019-06-24 | Discharge: 2019-06-24 | Disposition: A | Discharge status: Home / Self Care | Payer: Medicare HMO | Attending: Emergency Medicine | Admitting: Emergency Medicine

## 2019-06-24 DIAGNOSIS — Z7901 Long term (current) use of anticoagulants: Secondary | ICD-10-CM | POA: Diagnosis not present

## 2019-06-24 DIAGNOSIS — R079 Chest pain, unspecified: Secondary | ICD-10-CM | POA: Insufficient documentation

## 2019-06-24 DIAGNOSIS — F0390 Unspecified dementia without behavioral disturbance: Secondary | ICD-10-CM | POA: Diagnosis not present

## 2019-06-24 DIAGNOSIS — E039 Hypothyroidism, unspecified: Secondary | ICD-10-CM | POA: Insufficient documentation

## 2019-06-24 DIAGNOSIS — W010XXA Fall on same level from slipping, tripping and stumbling without subsequent striking against object, initial encounter: Secondary | ICD-10-CM | POA: Diagnosis not present

## 2019-06-24 DIAGNOSIS — R109 Unspecified abdominal pain: Secondary | ICD-10-CM | POA: Diagnosis not present

## 2019-06-24 DIAGNOSIS — W19XXXA Unspecified fall, initial encounter: Secondary | ICD-10-CM

## 2019-06-24 LAB — COMPREHENSIVE METABOLIC PANEL
ALT: 12 U/L (ref 0–44)
AST: 16 U/L (ref 15–41)
Albumin: 3.3 g/dL — ABNORMAL LOW (ref 3.5–5.0)
Alkaline Phosphatase: 72 U/L (ref 38–126)
Anion gap: 7 (ref 5–15)
BUN: 16 mg/dL (ref 8–23)
CO2: 27 mmol/L (ref 22–32)
Calcium: 9 mg/dL (ref 8.9–10.3)
Chloride: 112 mmol/L — ABNORMAL HIGH (ref 98–111)
Creatinine, Ser: 1.25 mg/dL — ABNORMAL HIGH (ref 0.44–1.00)
GFR calc Af Amer: 48 mL/min — ABNORMAL LOW (ref >60)
GFR calc non Af Amer: 42 mL/min — ABNORMAL LOW (ref >60)
Glucose, Bld: 103 mg/dL — ABNORMAL HIGH (ref 70–99)
Potassium: 3.9 mmol/L (ref 3.5–5.1)
Sodium: 146 mmol/L — ABNORMAL HIGH (ref 135–145)
Total Bilirubin: 0.2 mg/dL — ABNORMAL LOW (ref 0.3–1.2)
Total Protein: 6.7 g/dL (ref 6.5–8.1)

## 2019-06-24 LAB — CBC WITH DIFFERENTIAL/PLATELET
Abs Immature Granulocytes: 0.03 10*3/uL (ref 0.00–0.07)
Basophils Absolute: 0.1 10*3/uL (ref 0.0–0.1)
Basophils Relative: 1 %
Eosinophils Absolute: 0 10*3/uL (ref 0.0–0.5)
Eosinophils Relative: 1 %
HCT: 41.6 % (ref 36.0–46.0)
Hemoglobin: 13.5 g/dL (ref 12.0–15.0)
Immature Granulocytes: 1 %
Lymphocytes Relative: 19 %
Lymphs Abs: 1.1 10*3/uL (ref 0.7–4.0)
MCH: 30.7 pg (ref 26.0–34.0)
MCHC: 32.5 g/dL (ref 30.0–36.0)
MCV: 94.5 fL (ref 80.0–100.0)
Monocytes Absolute: 0.5 10*3/uL (ref 0.1–1.0)
Monocytes Relative: 8 %
Neutro Abs: 4.1 10*3/uL (ref 1.7–7.7)
Neutrophils Relative %: 70 %
Platelets: 233 10*3/uL (ref 150–400)
RBC: 4.4 MIL/uL (ref 3.87–5.11)
RDW: 13.3 % (ref 11.5–15.5)
WBC: 5.8 10*3/uL (ref 4.0–10.5)
nRBC: 0 % (ref 0.0–0.2)

## 2019-06-24 LAB — URINALYSIS, ROUTINE W REFLEX MICROSCOPIC
Bilirubin Urine: NEGATIVE
Glucose, UA: NEGATIVE mg/dL
Ketones, ur: NEGATIVE mg/dL
Nitrite: NEGATIVE
Protein, ur: NEGATIVE mg/dL
Specific Gravity, Urine: 1.012 (ref 1.005–1.030)
pH: 6 (ref 5.0–8.0)

## 2019-06-24 MED ORDER — IOHEXOL 300 MG/ML  SOLN
50.0000 mL | Freq: Once | INTRAMUSCULAR | Status: AC | PRN
  Administered 2019-06-24: 13:00:00 50 mL via INTRAVENOUS

## 2019-06-24 MED ORDER — HALOPERIDOL LACTATE 5 MG/ML IJ SOLN
2.0000 mg | Freq: Once | INTRAMUSCULAR | Status: AC
  Administered 2019-06-24: 5 mg via INTRAMUSCULAR

## 2019-06-24 MED ORDER — CEPHALEXIN 500 MG PO CAPS: 500.0000 mg | ORAL_CAPSULE | Freq: Two times a day (BID) | ORAL | 0 refills | Status: AC

## 2019-06-24 MED ORDER — QUETIAPINE 12.5 MG HALF TABLET
25.0000 mg | ORAL_TABLET | Freq: Every day | ORAL | Status: DC
  Filled 2019-06-24: qty 1

## 2019-06-24 MED ORDER — QUETIAPINE 12.5 MG HALF TABLET
25.0000 mg | ORAL_TABLET | Freq: Every day | ORAL | Status: DC
  Filled 2019-06-24: qty 2

## 2019-06-24 NOTE — ED Notes (Signed)
Pt with hx of dementia, alert to self only  confused agitated walking around the department looking for her children. RN Tawanna Solo was assisting her back to her room when the pt stated, "she took my children and I'm not leaving until she puts them in my hand." Pt walked toward CT as we attempted to get pt into her room. Doors were shut to the best of our ability after pt returned to [facility] zone. We were still having difficulty getting her back into her room, family went through the double doors going into the hallway as they did pt took off running. I stepped in trying to pull the doors to when pt grabbed me from my shoulders and pushed me against the double doors. NT Edison Nasuti was able to get pt off of me and into her room. Pt then restrained with 4 four point restraints, 2 mg Geodon given IM.

## 2019-06-24 NOTE — ED Provider Notes (Signed)
  Physical Exam  BP 133/80   Pulse 73   Temp 98.2 F (36.8 C) (Oral)   Resp (!) 22   SpO2 99%   Physical Exam  ED Course/Procedures   Clinical Course as of Jun 23 1609  Mon Jun 24, 2019  1556 Attempted to make contact with patient's daughter, Stephanie Hart. No answer, left VM with callback number.   [SJ]    Clinical Course User Index [SJ] Lorayne Bender, PA-C    Procedures  MDM   4:07 PM patient care assumed from Georga Kaufmann at shift change, please see his note for full HPI.  Briefly, patient here from SNF status post mechanical fall.  Labs have been within normal limits, CT imaging was obtained with some incidental findings.  Patient does have a prior history of PE, is currently on Eliquis.  Per prior provider attempted to contact family multiple times however was unable to do so.  She had one episode of becoming aggressive, was placed in restraint and given Haldol.   6:52 PM patient's daughter at the bedside Stephanie Hart, she is a dialysis nurse.  I have reviewed all CT findings with patient's daughter at length, she was provided with a copy of all her CTs.  I have also reviewed patient's lab work with daughter Stephanie Hart at the bedside.  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  Patient's daughter is agreeable of taking patient home at this time, however she will go back to facility.  Patient has not been agitated, no longer aggressive and very cooperative.  She reports no pain at this time, no chest pain, no shortness breath. She denies any SI,HI, Hallucinations.   I have discussed patient with Dr. Tyrone Nine who agrees with discharge and return to the facility.    Portions of this note were generated with Lobbyist. Dictation errors may occur despite best attempts at proofreading.       Stephanie Fitting, PA-C 06/24/19 [place], [place], DO 06/24/19 1858
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
## 2019-06-24 NOTE — ED Notes (Signed)
[facility] called for transport.  

## 2019-06-24 NOTE — ED Triage Notes (Signed)
Pt arrives via [facility] from [facility] after an unwitnessed fall over a wet floor. Pt arrives in c collar with a c/o of left sided rib pain. Pt has dementia and only oriented to person at  Baseline. Pt has no obvious injuries. Pt on Eliquis.

## 2019-06-24 NOTE — ED Notes (Signed)
[facility] called  

## 2019-06-24 NOTE — ED Notes (Signed)
Attempt to call [facility]

## 2019-06-24 NOTE — ED Notes (Signed)
ED Provider at bedside. 

## 2019-06-24 NOTE — Discharge Instructions (Addendum)
°  There were lung nodules again noted on CT scan.   There is also an abnormal structure noted on the chest CT which has grown since the last CT scan.  This can be due to mucus, however, in rare cases can represent lung cancer.  Repeat CT scan of the chest in 3 to 6 months is recommended.  There was an abnormality in the pancreas that was not able to be clearly identified.  A nonemergent, outpatient MRI of the pancreas with and without contrast is recommended to help better characterize this lesion.  This can be set up through a primary care provider.
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
## 2019-06-26 LAB — URINE CULTURE: Culture: >=100000 — AB

## 2019-06-27 ENCOUNTER — Telehealth: Payer: Self-pay

## 2019-06-27 NOTE — Telephone Encounter (Signed)
Post ED Visit - Positive Culture Follow-up  Culture report reviewed by antimicrobial stewardship pharmacist: [place] Team []  Elenor Quinones, Pharm.D. []  Heide Guile, Pharm.D., BCPS AQ-ID []  Parks Neptune, Pharm.D., BCPS []  Alycia Rossetti, Pharm.D., BCPS [x]  [place], Pharm.D., BCPS, AAHIVP []  Legrand Como, Pharm.D., BCPS, AAHIVP []  Salome Arnt, PharmD, BCPS []  Johnnette Gourd, PharmD, BCPS []  Hughes Better, PharmD, BCPS []  Leeroy Cha, PharmD []  Laqueta Linden, PharmD, BCPS []  Albertina Parr, PharmD  [place] Team []  Leodis Sias, PharmD []  Lindell Spar, PharmD []  Royetta Asal, PharmD []  Graylin Shiver, Rph []  Rema Fendt) Glennon Mac, PharmD []  Arlyn Dunning, PharmD []  Netta Cedars, PharmD []  Dia Sitter, PharmD []  Leone Haven, PharmD []  Gretta Arab, PharmD []  Theodis Shove, PharmD []  Peggyann Juba, PharmD []  Reuel Boom, PharmD   Positive urine culture Treated with Cephalexin, organism sensitive to the same and no further patient follow-up is required at this time.  Stephanie Hart, Stephanie Hart 06/27/2019, 9:18 AM

## 2019-06-27 NOTE — Telephone Encounter (Signed)
Post ED Visit - Positive Culture Follow-up  Culture report reviewed by antimicrobial stewardship pharmacist: [facility] []  Elenor Quinones, Pharm.D. []  Heide Guile, Pharm.D., BCPS AQ-ID []  Parks Neptune, Pharm.D., BCPS []  Alycia Rossetti, Pharm.D., BCPS [x]  [place], Pharm.D., BCPS, AAHIVP []  Legrand Como, Pharm.D., BCPS, AAHIVP []  Salome Arnt, PharmD, BCPS []  Johnnette Gourd, PharmD, BCPS []  Hughes Better, PharmD, BCPS []  Leeroy Cha, PharmD []  Laqueta Linden, PharmD, BCPS []  Albertina Parr, PharmD  [facility] []  Leodis Sias, PharmD []  Lindell Spar, PharmD []  Royetta Asal, PharmD []  Graylin Shiver, Rph []  Rema Fendt) Glennon Mac, PharmD []  Arlyn Dunning, PharmD []  Netta Cedars, PharmD []  Dia Sitter, PharmD []  Leone Haven, PharmD []  Gretta Arab, PharmD []  Theodis Shove, PharmD []  Peggyann Juba, PharmD []  Reuel Boom, PharmD   Positive urine culture Treated with Cephalexin, organism sensitive to the same and no further patient follow-up is required at this time.  Carletha, Dawn 06/27/2019, 9:10 AM

## 2019-07-10 ENCOUNTER — Other Ambulatory Visit: Payer: Self-pay | Admitting: Physician Assistant

## 2019-07-10 DIAGNOSIS — K869 Disease of pancreas, unspecified: Secondary | ICD-10-CM

## 2019-09-02 ENCOUNTER — Other Ambulatory Visit: Payer: Medicare HMO

## 2020-01-09 ENCOUNTER — Other Ambulatory Visit: Payer: Self-pay | Admitting: Physician Assistant

## 2020-01-09 DIAGNOSIS — R911 Solitary pulmonary nodule: Secondary | ICD-10-CM

## 2020-01-27 ENCOUNTER — Inpatient Hospital Stay: Admission: RE | Admit: 2020-01-27 | Payer: Medicare HMO | Source: Ambulatory Visit

## 2020-01-27 ENCOUNTER — Other Ambulatory Visit: Payer: Medicare HMO

## 2020-02-16 IMAGING — CT CT ANGIO CHEST
3 of 9 series · 18 of 36 positions shown · IV contrast (iopamidol)
Comparison: Chest x-ray November 25, 2018

CLINICAL DATA: Chest pain.

EXAM:
CT ANGIOGRAPHY CHEST WITH CONTRAST
TECHNIQUE: Multidetector CT imaging of the chest was performed using the
standard protocol during bolus administration of intravenous
contrast. Multiplanar CT image reconstructions and MIPs were
obtained to evaluate the vascular anatomy.
CONTRAST:  51mL OTJBRW-PJX IOPAMIDOL (OTJBRW-PJX) INJECTION 76%

[Series 6: pe thins · axial · 0.71mm/px · z∈[-12,+209]mm · 14 of 257 slices shown]
[im 18/257  lung]
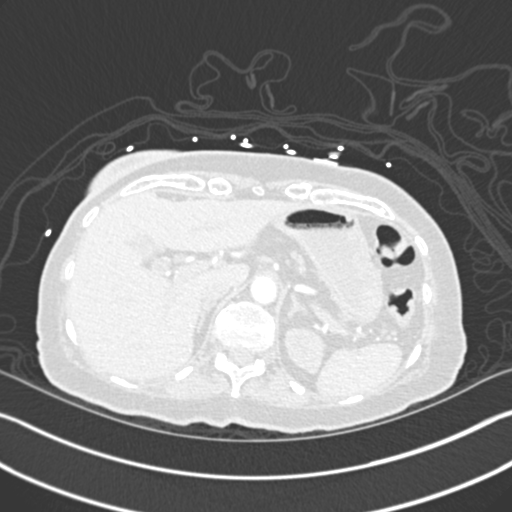
[im 35/257  mediastinal]
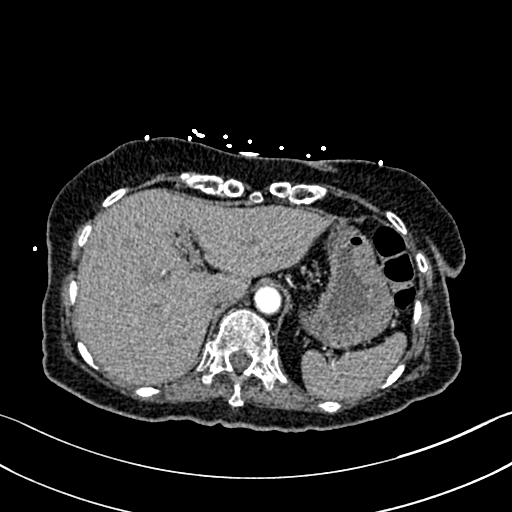
[im 52/257  lung]
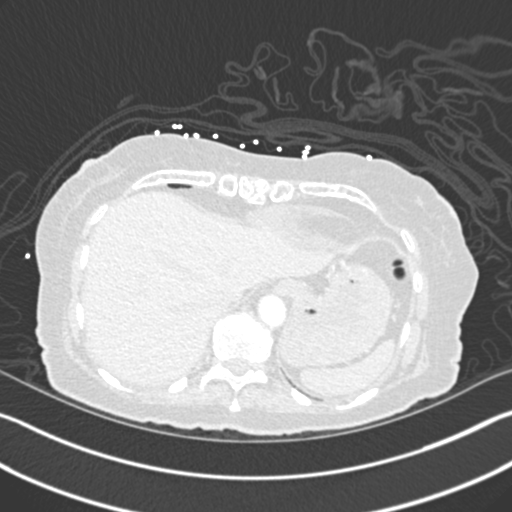
[im 69/257  mediastinal]
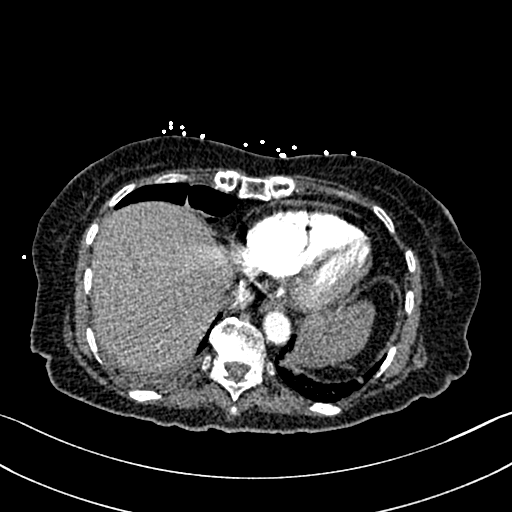
[im 86/257  lung]
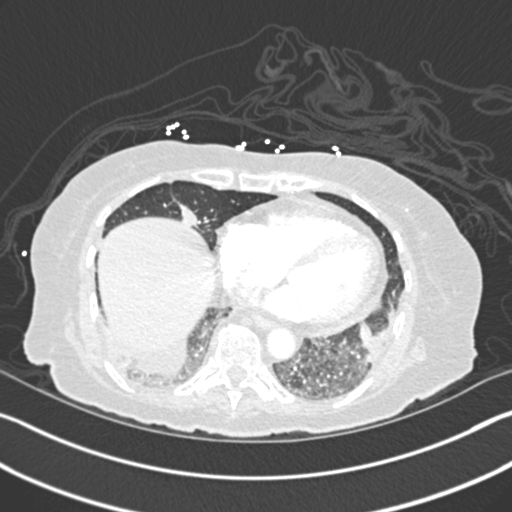
[im 103/257  mediastinal]
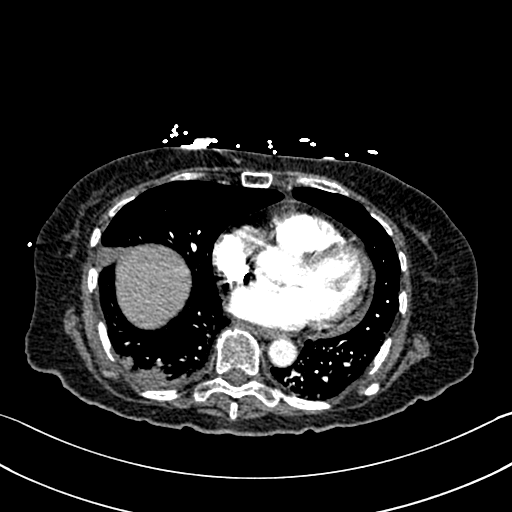
[im 120/257  lung]
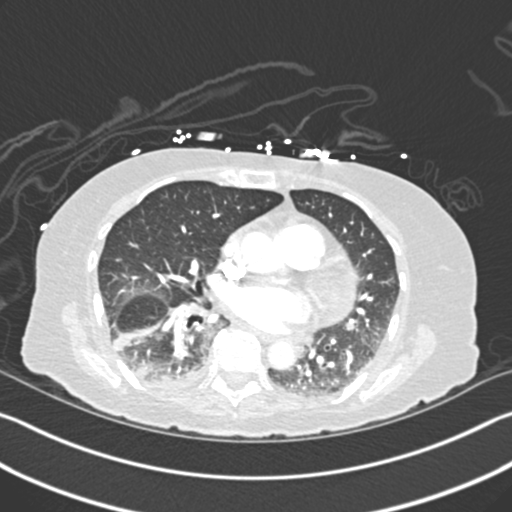
[im 137/257  mediastinal]
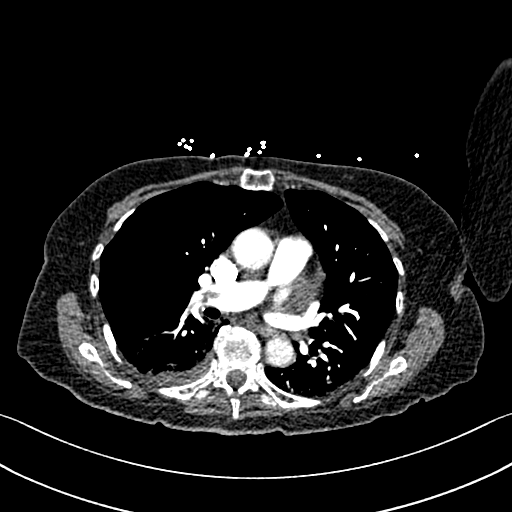
[im 154/257  lung]
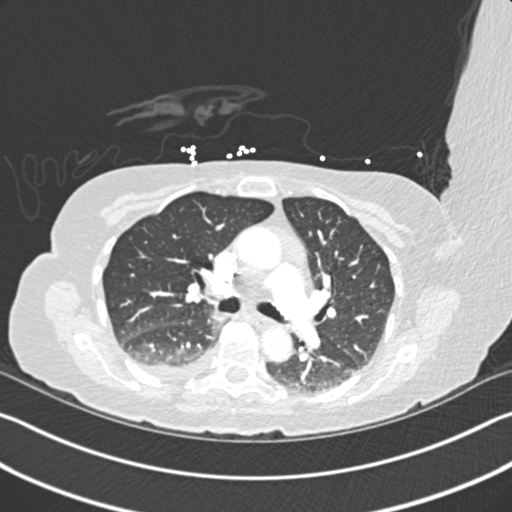
[im 171/257  mediastinal]
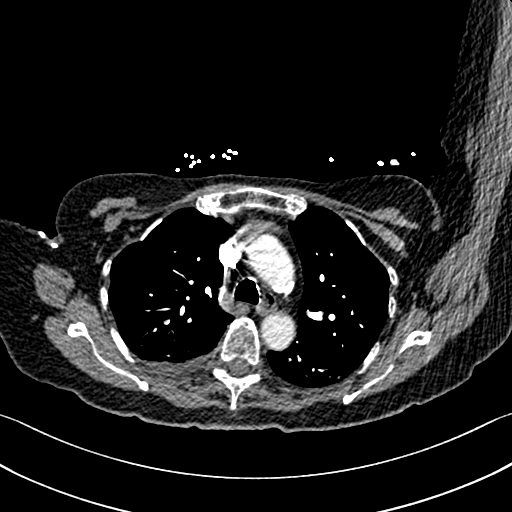
[im 188/257  lung]
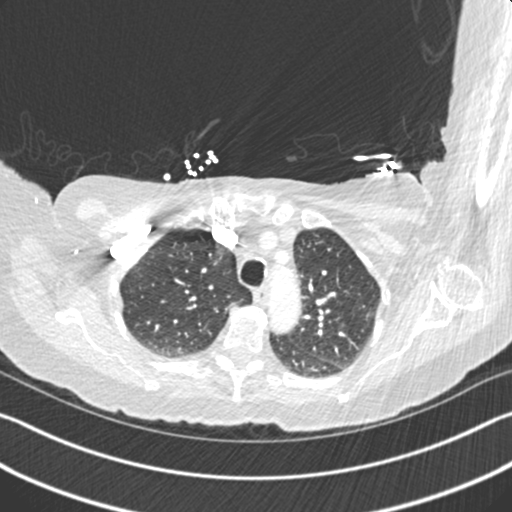
[im 205/257  mediastinal]
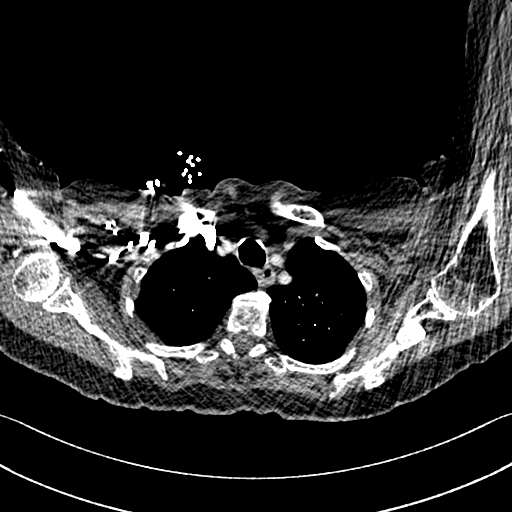
[im 222/257  lung]
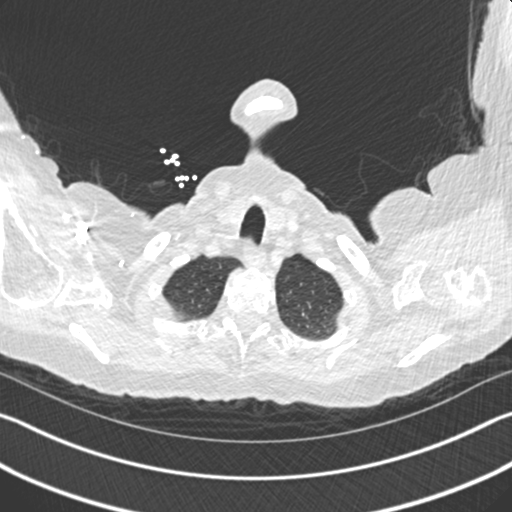
[im 239/257  mediastinal]
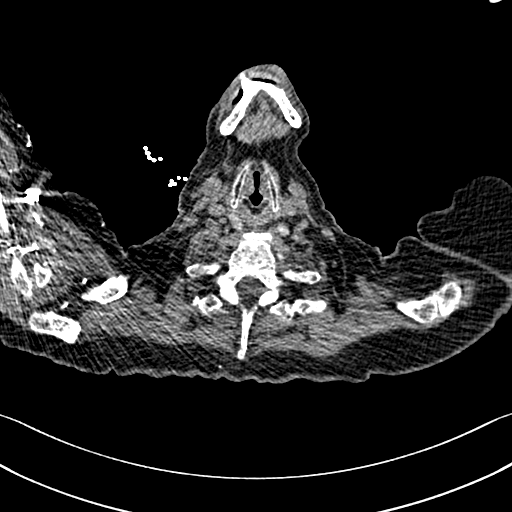

[Series 7: pe lung · axial · 0.84mm/px · z∈[+77,+176]mm · 3 of 67 slices shown]
[im 17/67  mediastinal]
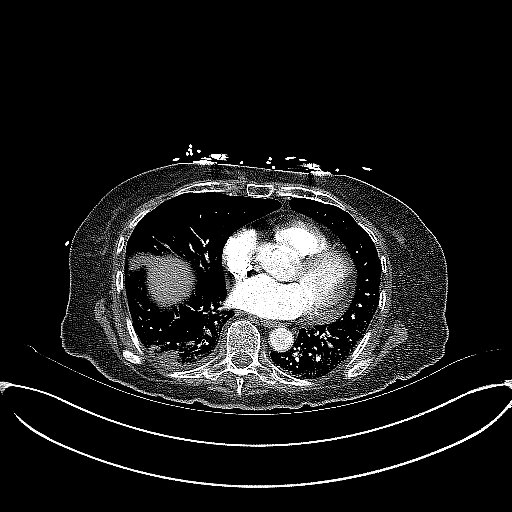
[im 34/67  mediastinal]
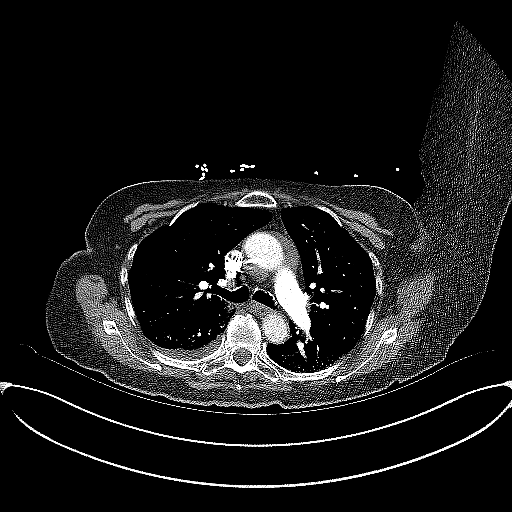
[im 50/67  mediastinal]
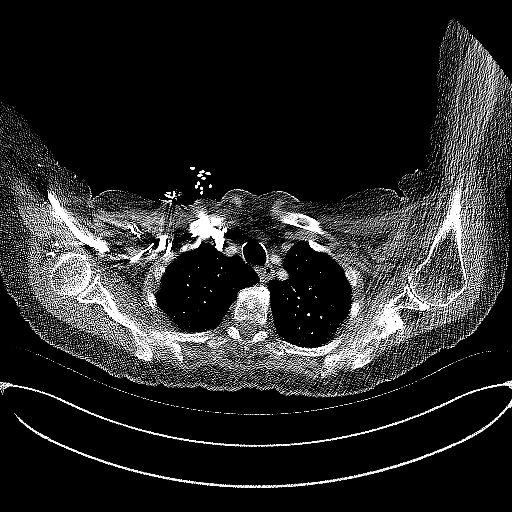

[Series 8: pe coronal mpr · coronal · 0.54mm/px · 1 of 108 slices shown]
[im 54/108  mediastinal]
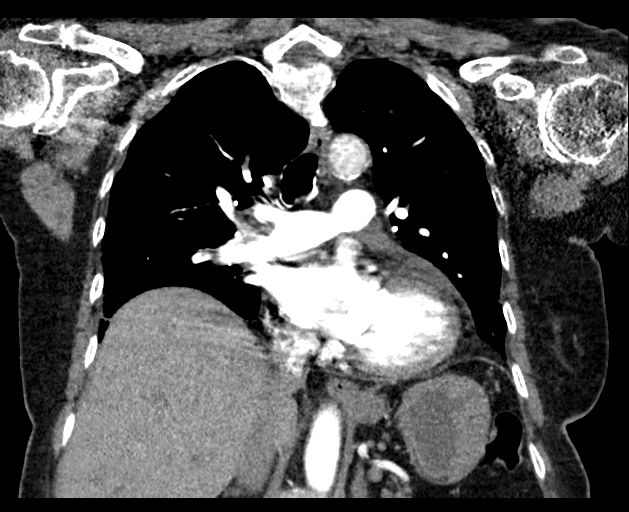

[18 of 36 positions shown; findings below may reference images not displayed]

FINDINGS: Cardiovascular: No aneurysm or dissection seen in the thoracic aorta
with mild atherosclerotic change. The heart size is normal. Mild
coronary artery calcifications in the left coronary arteries.
Pulmonary emboli are identified in multiple branches of the right
upper lobe pulmonary arteries and in multiple right lower lobe
branches. Lobar branches are involved on the right. The clot burden
extends into the periphery of the right main pulmonary artery. A
small subsegmental embolus is seen on the left on series 6, image
104.

Mediastinum/Nodes: There is a small right pleural effusion and a
small pericardial effusion. Thyroid and esophagus are unremarkable.
No adenopathy. The chest wall is unremarkable.

Lungs/Pleura: Central airways are normal. No pneumothorax. Small
right-sided pleural effusion. Opacities in the dependent portions of
the lung bases are seen, largely explained by atelectasis. Some of
the opacities are a little more rounded and developing infarcts
given known pulmonary emboli are not excluded. Opacity in the right
middle lobe is thought to be atelectasis. No other acute
abnormalities within the lungs.

Upper Abdomen: No acute abnormality.

Musculoskeletal: No chest wall abnormality. No acute or significant
osseous findings.

Review of the MIP images confirms the above findings.
IMPRESSION: 1. Pulmonary emboli are identified, right greater than left. Lobar
emboli are seen in the right upper and lower lobes with a small
subsegmental at thrombus on the left. The most central embolus on
the right reaches the periphery of the right main pulmonary artery.
The right ventricle measures 4.1 cm in greatest dimension and the
left measures 5.0 cm. The RV/LV ratio is 0.82 with no convincing
evidence of heart strain.
2. Bibasilar opacities are largely explained by atelectasis.
However, mildly rounded opacities in the right base could represent
early developing infarcts. Recommend attention on follow-up.
3. Mild atherosclerotic change in the nonaneurysmal aorta. Mild
coronary artery calcifications.
4. Small pleural effusion.

Findings called to Dr. Tiger.

Aortic Atherosclerosis ([id number]).

## 2020-02-24 ENCOUNTER — Ambulatory Visit
Admission: RE | Admit: 2020-02-24 | Discharge: 2020-02-24 | Disposition: A | Discharge status: Home / Self Care | Payer: Medicare HMO | Source: Ambulatory Visit | Attending: Physician Assistant | Admitting: Physician Assistant

## 2020-02-24 DIAGNOSIS — K869 Disease of pancreas, unspecified: Secondary | ICD-10-CM

## 2020-02-24 DIAGNOSIS — R911 Solitary pulmonary nodule: Secondary | ICD-10-CM

## 2020-11-29 ENCOUNTER — Encounter (HOSPITAL_COMMUNITY): Payer: Self-pay | Admitting: Emergency Medicine

## 2020-11-29 ENCOUNTER — Emergency Department (HOSPITAL_COMMUNITY)

## 2020-11-29 ENCOUNTER — Emergency Department (HOSPITAL_COMMUNITY)
Admission: EM | Admit: 2020-11-29 | Discharge: 2020-11-29 | Disposition: A | Discharge status: Home / Self Care | Attending: Emergency Medicine | Admitting: Emergency Medicine

## 2020-11-29 ENCOUNTER — Other Ambulatory Visit: Payer: Self-pay

## 2020-11-29 DIAGNOSIS — Z86711 Personal history of pulmonary embolism: Secondary | ICD-10-CM | POA: Insufficient documentation

## 2020-11-29 DIAGNOSIS — Y999 Unspecified external cause status: Secondary | ICD-10-CM | POA: Diagnosis not present

## 2020-11-29 DIAGNOSIS — S92344A Nondisplaced fracture of fourth metatarsal bone, right foot, initial encounter for closed fracture: Secondary | ICD-10-CM | POA: Insufficient documentation

## 2020-11-29 DIAGNOSIS — Y929 Unspecified place or not applicable: Secondary | ICD-10-CM | POA: Insufficient documentation

## 2020-11-29 DIAGNOSIS — Y939 Activity, unspecified: Secondary | ICD-10-CM | POA: Insufficient documentation

## 2020-11-29 DIAGNOSIS — E039 Hypothyroidism, unspecified: Secondary | ICD-10-CM | POA: Diagnosis not present

## 2020-11-29 DIAGNOSIS — Z7901 Long term (current) use of anticoagulants: Secondary | ICD-10-CM | POA: Insufficient documentation

## 2020-11-29 DIAGNOSIS — F0390 Unspecified dementia without behavioral disturbance: Secondary | ICD-10-CM | POA: Insufficient documentation

## 2020-11-29 DIAGNOSIS — X58XXXA Exposure to other specified factors, initial encounter: Secondary | ICD-10-CM | POA: Diagnosis not present

## 2020-11-29 DIAGNOSIS — Z79899 Other long term (current) drug therapy: Secondary | ICD-10-CM | POA: Insufficient documentation

## 2020-11-29 DIAGNOSIS — S99921A Unspecified injury of right foot, initial encounter: Secondary | ICD-10-CM | POA: Diagnosis present

## 2020-11-29 NOTE — ED Notes (Addendum)
Attempted to contact [facility] to give discharge instructions. No answer  [phone]

## 2020-11-29 NOTE — ED Notes (Signed)
Patient was able to ambulate with assistance

## 2020-11-29 NOTE — ED Triage Notes (Signed)
Patient BIBA from [facility], staff reports patient c/o ankle pain. Patient is ambulatory, no recent falls. Hx of dementia.    BP 139/87 HR 70 SpO2 97%  RR 20 CBG 131

## 2020-11-29 NOTE — Discharge Instructions (Addendum)
Please follow-up with orthopedics.  Please take Tylenol as directed on the bottle for pain.  Use the attached directions.  She has any new worsening concerning symptom please come back to the emergency department.  Get help right away if you have: Any of the following in your toes or your foot, even after loosening your splint (if applicable): Numbness. Tingling. Coldness. Blue skin. Redness or swelling that gets worse. Pain that suddenly becomes severe.

## 2020-11-29 NOTE — ED Provider Notes (Signed)
[facility] Note   CSN: [id number] Arrival date & time: 11/29/20  1340     History No chief complaint on file.   Stephanie Hart is a 80 y.o. female with pertinent past medical history of advanced dementia, PE, heart disease that presents to the emergency department today for complaints of ankle pain by EMS.  Patient lives at [facility], level 5 caveat due to advanced dementia.    Per EMS staff reports that patient was complaining of ankle pain earlier, they are unsure which side she was complaining of.  According EMS no, patient was ambulatory with no recent falls.  I tried calling [address] multiple times without any answer.  Did call patient's daughter who states that the facility had called her saying that she was going to the emergency department today for right ankle pain and inability to bear weight due to pain.  No recent falls that she is aware of.  Was acting normally yesterday.  No other symptoms. Normally walks around, but wasn't today.   HPI     Past Medical History:  Diagnosis Date  . Dementia (HCC)   . Thyroid disease     Patient Active Problem List   Diagnosis Date Noted  . Pulmonary emboli (HCC) 11/25/2018  . Dementia (HCC) 11/25/2018  . Hypothyroidism 11/25/2018  . Hypokalemia 11/25/2018    Past Surgical History:  Procedure Laterality Date  . ABDOMINAL HYSTERECTOMY       OB History   No obstetric history on file.     Family History  Problem Relation Age of Onset  . Diabetes Mother   . Alcohol abuse Father     Social History   Tobacco Use  . Smoking status: Never Smoker  . Smokeless tobacco: Never Used  Substance Use Topics  . Alcohol use: Never  . Drug use: Never    Home Medications Prior to Admission medications   Medication Sig Start Date End Date Taking? Authorizing Provider  acetaminophen (TYLENOL) 500 MG tablet Take 500 mg by mouth every 4 (four) hours as needed for headache (minor  discomfort/ fever 99.5-101 F).    [provider]  alum & mag hydroxide-simeth (MAALOX/MYLANTA) 200-200-20 MG/5ML suspension Take 30 mLs by mouth every 6 (six) hours as needed for indigestion or heartburn.    [provider]  apixaban (ELIQUIS) 5 MG TABS tablet Take 5 mg by mouth 2 (two) times daily.    [provider]  Flaxseed, Linseed, (FLAX SEED OIL) 1000 MG CAPS Take 1,000 mg by mouth every morning.     [provider]  guaifenesin (ROBAFEN) 100 MG/5ML syrup Take 200 mg by mouth every 6 (six) hours as needed for cough.    [provider]  levothyroxine (SYNTHROID, LEVOTHROID) 50 MCG tablet Take 50 mcg by mouth daily before breakfast.  06/06/18   [provider]  loperamide (IMODIUM) 2 MG capsule Take by mouth as needed for diarrhea or loose stools.    [provider]  magnesium hydroxide (MILK OF MAGNESIA) 400 MG/5ML suspension Take 30 mLs by mouth at bedtime as needed (constipation).    [provider]  memantine (NAMENDA) 10 MG tablet Take 10 mg by mouth 2 (two) times daily.  08/21/18   [provider]  neomycin-bacitracin-polymyxin (NEOSPORIN) [phone] ointment Apply 1 application topically as needed (minor skin tears/ abrasions).    [provider]  pantoprazole (PROTONIX) 40 MG tablet Take 1 tablet (40 mg total) by mouth daily for  30 days. Patient taking differently: Take 40 mg by mouth daily before breakfast.  11/27/18 09/19/19  Amin, Loura Halt, MD  QUEtiapine (SEROQUEL) 25 MG tablet Take 25 mg by mouth 3 (three) times daily as needed (agitation).  11/19/18   [provider]  vitamin B-12 (CYANOCOBALAMIN) 1000 MCG tablet Take 1,000 mcg by mouth every morning.  06/05/18   [provider]  vitamin E 400 UNIT capsule Take 800 Units by mouth every morning.     [provider]    Allergies    Donepezil, Corn oil, and Lactobacillus  [acidophilus]  Review of Systems   Review of  Systems  Unable to perform ROS: Dementia    Physical Exam Updated Vital Signs BP (!) 148/70 (BP Location: Right Arm)   Pulse 79   Temp 98.5 F (36.9 C) (Oral)   Resp 18   SpO2 100%   Physical Exam Constitutional:      General: She is not in acute distress.    Appearance: Normal appearance. She is not ill-appearing, toxic-appearing or diaphoretic.  Cardiovascular:     Rate and Rhythm: Normal rate and regular rhythm.     Pulses: Normal pulses.  Pulmonary:     Effort: Pulmonary effort is normal.     Breath sounds: Normal breath sounds.  Musculoskeletal:        General: Normal range of motion.     Comments: Tenderness to right midfoot, no erythema or crepitus.  Normal range of motion to bilateral ankles.  Normal strength to ankles and foot.  PT pulses bilaterally 2+.  Skin:    General: Skin is warm and dry.     Capillary Refill: Capillary refill takes less than 2 seconds.  Neurological:     General: No focal deficit present.     Mental Status: She is alert. Mental status is at baseline.  Psychiatric:        Mood and Affect: Mood normal.        Behavior: Behavior normal.        Thought Content: Thought content normal.     ED Results / Procedures / Treatments   Labs (all labs ordered are listed, but only abnormal results are displayed) Labs Reviewed - No data to display  EKG None  Radiology DG Ankle Complete Left  Result Date: 11/29/2020 CLINICAL DATA:  Ankle pain. EXAM: LEFT ANKLE COMPLETE - 3+ VIEW COMPARISON:  None. FINDINGS: There is no evidence of fracture, dislocation, or joint effusion. There is no evidence of arthropathy or other focal bone abnormality. Soft tissues are unremarkable. IMPRESSION: Negative. Electronically Signed   By: Gerome Sam III M.D   On: 11/29/2020 14:44   DG Ankle Complete Right  Result Date: 11/29/2020 CLINICAL DATA:  Pain. EXAM: RIGHT ANKLE - COMPLETE 3+ VIEW COMPARISON:  None. FINDINGS: There is no evidence of fracture, dislocation,  or joint effusion. There is no evidence of arthropathy or other focal bone abnormality. Soft tissues are unremarkable. IMPRESSION: Negative. Electronically Signed   By: Gerome Sam III M.D   On: 11/29/2020 14:44   DG Foot Complete Left  Result Date: 11/29/2020 CLINICAL DATA:  Ankle pain. EXAM: LEFT FOOT - COMPLETE 3+ VIEW COMPARISON:  None. FINDINGS: There is no evidence of fracture or dislocation. There is no evidence of arthropathy or other focal bone abnormality. Soft tissues are unremarkable. IMPRESSION: Negative. Electronically Signed   By: Gerome Sam III M.D   On: 11/29/2020 14:43   DG Foot Complete Right  Result Date:  11/29/2020 CLINICAL DATA:  Pain EXAM: RIGHT FOOT COMPLETE - 3+ VIEW COMPARISON:  None. FINDINGS: Age-indeterminate fracture through the distal fourth metatarsal. This could be a healed fracture but is nonspecific on today's imaging. No other abnormalities. IMPRESSION: Age-indeterminate fracture through the distal fourth metatarsal. Recommend correlation for pain in this region. It is possible this is a healed fracture but the findings are indeterminate on this film. Electronically Signed   By: Gerome Sam III M.D   On: 11/29/2020 14:46    Procedures Procedures   Medications Ordered in ED Medications - No data to display  ED Course  I have reviewed the triage vital signs and the nursing notes.  Pertinent labs & imaging results that were available during my care of the patient were reviewed by me and considered in my medical decision making (see chart for details).    MDM Rules/Calculators/A&P                         Stephanie Hart is a 80 y.o. female with pertinent past medical history of advanced dementia, PE, heart disease that presents to the emergency department today for complaints of ankle pain by EMS.  Plain films do show age-indeterminate distal fourth metatarsal fracture, this is where patient is having pain.  Patient is distally neurovascular intact.   Will place in cam boot and have patient follow-up with Ortho.  Upon reevaluation patient has cam boot on, patient is refusing to walk.  Did try to have patient walk a couple times, however patient is aggressively refusing.  Did speak to daughter on the phone who states that this is pretty common for her.    Tech was able to walk her, patient tolerated boot.  Spoke to daughter about plan, daughter is agreeable.  Patient be discharged at this time, is not complaining of any pain.   Doubt need for further emergent work up at this time. I explained the diagnosis and have given explicit precautions to return to the ER including for any other new or worsening symptoms. The patient understands and accepts the medical plan as it's been dictated and I have answered their questions. Discharge instructions concerning home care and prescriptions have been given. The patient is STABLE and is discharged to home in good condition.  I discussed this case with my attending physician who cosigned this note including patient's presenting symptoms, physical exam, and planned diagnostics and interventions. Attending physician stated agreement with plan or made changes to plan which were implemented.     Final Clinical Impression(s) / ED Diagnoses Final diagnoses:  Nondisplaced fracture of fourth metatarsal bone, right foot, initial encounter for closed fracture    Rx / DC Orders ED Discharge Orders    None       Farrel Gordon, PA-C 11/29/20 1600    Maia Plan, MD 12/01/20 1236

## 2020-11-29 NOTE — Progress Notes (Signed)
Orthopedic Tech Progress Note Patient Details:  Stephanie Hart Jul 18, 1941 [id number]  Ortho Devices Ortho Device/Splint Location: cam walker boot LLE Ortho Device/Splint Interventions: Ordered,Application   Post Interventions Patient Tolerated: Well Instructions Provided: Care of device   Jennye Moccasin 11/29/2020, 3:39 PM

## 2020-11-29 NOTE — ED Notes (Signed)
Non emergency transport contacted

## 2022-04-19 DEATH — deceased
